# Patient Record
Sex: Female | Born: 1950
Health system: Southern US, Community
[De-identification: ages and names within clinical notes are randomized; demographics above are authoritative.]

## PROBLEM LIST (undated history)

## (undated) DIAGNOSIS — E785 Hyperlipidemia, unspecified: Secondary | ICD-10-CM

## (undated) DIAGNOSIS — E079 Disorder of thyroid, unspecified: Secondary | ICD-10-CM

## (undated) DIAGNOSIS — E119 Type 2 diabetes mellitus without complications: Secondary | ICD-10-CM

## (undated) DIAGNOSIS — I10 Essential (primary) hypertension: Secondary | ICD-10-CM

## (undated) HISTORY — PX: TUBAL LIGATION: SHX77

## (undated) HISTORY — DX: Hyperlipidemia, unspecified: E78.5

## (undated) HISTORY — DX: Essential (primary) hypertension: I10

## (undated) HISTORY — DX: Disorder of thyroid, unspecified: E07.9

## (undated) HISTORY — PX: CHOLECYSTECTOMY: SHX55

## (undated) HISTORY — DX: Type 2 diabetes mellitus without complications: E11.9

---

## 2001-11-28 ENCOUNTER — Other Ambulatory Visit: Admission: RE | Admit: 2001-11-28 | Discharge: 2001-11-28 | Payer: Self-pay | Admitting: Obstetrics and Gynecology

## 2003-03-09 ENCOUNTER — Other Ambulatory Visit: Admission: RE | Admit: 2003-03-09 | Discharge: 2003-03-09 | Payer: Self-pay | Admitting: Obstetrics and Gynecology

## 2003-08-01 ENCOUNTER — Encounter: Payer: Self-pay | Admitting: Obstetrics and Gynecology

## 2003-08-01 ENCOUNTER — Encounter: Admission: RE | Admit: 2003-08-01 | Discharge: 2003-08-01 | Payer: Self-pay | Admitting: Obstetrics and Gynecology

## 2004-10-08 ENCOUNTER — Other Ambulatory Visit: Admission: RE | Admit: 2004-10-08 | Discharge: 2004-10-08 | Payer: Self-pay | Admitting: Obstetrics and Gynecology

## 2005-01-30 ENCOUNTER — Ambulatory Visit (HOSPITAL_COMMUNITY): Admission: RE | Admit: 2005-01-30 | Discharge: 2005-01-30 | Payer: Self-pay | Admitting: Orthopaedic Surgery

## 2006-04-06 ENCOUNTER — Other Ambulatory Visit: Admission: RE | Admit: 2006-04-06 | Discharge: 2006-04-06 | Payer: Self-pay | Admitting: Obstetrics and Gynecology

## 2007-05-19 ENCOUNTER — Ambulatory Visit (HOSPITAL_COMMUNITY): Admission: RE | Admit: 2007-05-19 | Discharge: 2007-05-19 | Payer: Self-pay | Admitting: Gastroenterology

## 2007-06-02 ENCOUNTER — Ambulatory Visit (HOSPITAL_COMMUNITY): Admission: RE | Admit: 2007-06-02 | Discharge: 2007-06-02 | Payer: Self-pay | Admitting: Obstetrics and Gynecology

## 2007-06-02 ENCOUNTER — Encounter (INDEPENDENT_AMBULATORY_CARE_PROVIDER_SITE_OTHER): Payer: Self-pay | Admitting: Obstetrics and Gynecology

## 2007-06-27 ENCOUNTER — Encounter: Admission: RE | Admit: 2007-06-27 | Discharge: 2007-06-27 | Payer: Self-pay | Admitting: Family Medicine

## 2011-01-18 ENCOUNTER — Encounter: Payer: Self-pay | Admitting: Orthopaedic Surgery

## 2011-05-15 NOTE — Op Note (Signed)
Marissa Wells, Marissa Wells            ACCOUNT NO.:  [id number]   MEDICAL RECORD NO.:  [id number]          PATIENT TYPE:  AMB   LOCATION:                                FACILITY:  [facility]   PHYSICIAN:  Guy Sandifer. Henderson Cloud, M.D. DATE OF BIRTH:  Sep 12, 1951   DATE OF PROCEDURE:  06/02/2007  DATE OF DISCHARGE:  06/02/2007                               OPERATIVE REPORT   PREOPERATIVE DIAGNOSIS:  Postmenopausal bleeding.   POSTOPERATIVE DIAGNOSIS:  Postmenopausal bleeding.   PROCEDURE:  Hysteroscopy, dilatation and curettage, and 1% Xylocaine  paracervical block.   SURGEON:  Guy Sandifer. Henderson Cloud, M.D.   ANESTHESIA:  General with LMA.   SPECIMENS:  Endometrial curettings.   ESTIMATED BLOOD LOSS:  Minimal.   IN'S AND OUT'S:  Sorbitol distending media, 20-mL deficit.   INDICATIONS AND CONSENT:  This patient is a 60 year old married black  female, G3, P2, who is postmenopausal for at least two years.  She had a  one-week episode of postmenopausal bleeding.  Hysteroscopy and D and C  have been discussed preoperatively.  The potential risks and  complications were discussed preoperatively, including but not limited  to infection, uterine perforation, organ damage, bleeding requiring  transfusion of blood products, possible transfusion reaction, HIV and  hepatitis acquisition, DVT, PE, and pneumonia.  All questions were  answered and consent is found in the chart.   FINDINGS:  The endometrial cavity is without abnormal structure.  The  fallopian tubes and ostia are identified bilaterally.   PROCEDURE IN DETAIL:  The patient was taken to the operating room, where  she was identified, placed in the dorsal supine position, and general  anesthesia was induced via LMA.  The was then placed in the dorsal  lithotomy position, where she was prepped, bladder straight  catheterized, and then she was draped in the sterile fashion.  A bivalve  speculum was placed in the vagina and the anterior cervical lip  was  injected with 1% plain Xylocaine and grasped with a single-tooth  tenaculum.  A paracervical block was then placed at the 2, 4, 5, 7, 8,  and 10 o'clock positions, with approximately 20 mL of 1% plain  Xylocaine.  The cervix was then gently progressively dilated to a 27  dilator.  Diagnostic hysteroscope was placed into the endocervical canal  and advanced under direct visualization  using sorbitol distending media.  The above findings were noted.  The  hysteroscope was withdrawn and sharp curettage was carried out for scant  tissue.  The instruments were removed.  Good hemostasis was noted.  All  counts were correct.  The patient was awakened and taken to the recovery  room in stable condition.      Guy Sandifer Henderson Cloud, M.D.  Electronically Signed     JET/MEDQ  D:  06/02/2007  T:  06/02/2007  Job:  [id number]

## 2011-05-15 NOTE — H&P (Signed)
NAMEEARLENA, WERST            ACCOUNT NO.:  [id number]   MEDICAL RECORD NO.:  [id number]           PATIENT TYPE:   LOCATION:                                FACILITY:  [facility]   PHYSICIAN:  Guy Sandifer. Henderson Cloud, M.D.      DATE OF BIRTH:   DATE OF ADMISSION:  06/02/2007  DATE OF DISCHARGE:                              HISTORY & PHYSICAL   CHIEF COMPLAINT:  Postmenopausal bleeding.   HISTORY OF PRESENT ILLNESS:  This patient is a 60 year old married  African American female G3, P2 who has been amenorrheic for the past two  years.  She has occasional vasomotor symptoms.  In March, she had more  than a week of heavy menstrual flow.  She has had no bleeding subsequent  to that.  Ultrasound on May 31, 2007, revealed the uterus measuring 6.6  x 2.9 x 4.1 cm.  Endometrial stripe was 2.6 mm.  Adnexa were without  masses and there was no free fluid.  After discussing the options, she  has been admitted for  hysteroscopy D&C.  Potential risks and  complications have been discussed with the patient preoperatively.   PAST MEDICAL HISTORY:  1. Depression.  2. Chronic hypertension.  3. Spontaneous pneumothorax x2.   PAST SURGICAL HISTORY:  1. Tubal ligation.  2. D&C.  3. Cholecystectomy.   OBSTETRICAL HISTORY:  Vaginal delivery x3.   FAMILY HISTORY:  Diabetes in mother and father.  Chronic hypertension  father and sister.  Heart disease in father.  Alzheimer's disease in  aunt.  Hypothyroidism in sister.   MEDICATIONS:  Benicar and Ambien p.r.n.   ALLERGIES:  1. COMPAZINE - loss of control of her muscles.  2. LISINOPRIL - cough.   SOCIAL HISTORY:  Tobacco - half pack a day.  Denies drug or alcohol  abuse.   REVIEW OF SYSTEMS:  NEUROLOGIC:  Denies headache.  CARDIAC:  Denies  chest pain.  PULMONARY:  Denies shortness of breath.   PHYSICAL EXAMINATION:  VITAL SIGNS:  Height 5 feet 6 inches, blood  pressure 128/80.  HEENT:  Without thyromegaly.  LUNGS:  Clear to auscultation.  CARDIOVASCULAR:  Regular rate and rhythm.  BACK:  Without CVA tenderness.  BREASTS:  Without masses, retraction or discharge.  ABDOMEN:  Soft, nontender without masses.  PELVIC:  Vulva, vagina and cervix without lesion.  Uterus is normal  size, mobile and nontender.  Adnexa nontender without masses.  EXTREMITIES:  Grossly within normal limits.  NEUROLOGIC:  Grossly within normal limits.   ASSESSMENT:  Postmenopausal bleeding.   PLAN:  Hysteroscopy, possible resectoscope, dilatation and curettage.      Guy Sandifer Henderson Cloud, M.D.  Electronically Signed     JET/MEDQ  D:  05/31/2007  T:  05/31/2007  Job:  [id number]

## 2011-10-15 LAB — COMPREHENSIVE METABOLIC PANEL
AST: 21
Albumin: 3.7
Alkaline Phosphatase: 101
BUN: 10
Chloride: 110
GFR calc Af Amer: >60
Potassium: 4.2
Sodium: 141
Total Protein: 7.5

## 2011-10-15 LAB — CBC
HCT: 39.3
Platelets: 370
RDW: 14.1 — ABNORMAL HIGH
WBC: 6.6

## 2013-03-14 ENCOUNTER — Telehealth: Payer: Self-pay | Admitting: Nurse Practitioner

## 2013-03-14 ENCOUNTER — Other Ambulatory Visit: Payer: Self-pay | Admitting: Nurse Practitioner

## 2013-03-14 MED ORDER — ZOLPIDEM TARTRATE 10 MG PO TABS: 10.0000 mg | ORAL_TABLET | Freq: Every evening | ORAL | Status: DC | PRN

## 2013-03-14 NOTE — Telephone Encounter (Signed)
NEEDS ANOTHER PRESCRIPTION FOR AMBIEN 10MG . SHE WILL PICK UP WHEN READY

## 2013-03-14 NOTE — Telephone Encounter (Signed)
Let patient know to pick up RX

## 2013-03-15 ENCOUNTER — Telehealth: Payer: Self-pay | Admitting: Family Medicine

## 2013-03-15 NOTE — Telephone Encounter (Signed)
Pt aware to pick up

## 2013-04-13 ENCOUNTER — Other Ambulatory Visit: Payer: Self-pay | Admitting: Nurse Practitioner

## 2013-04-21 ENCOUNTER — Other Ambulatory Visit: Payer: Self-pay

## 2013-04-21 MED ORDER — ZOLPIDEM TARTRATE 10 MG PO TABS: 10.0000 mg | ORAL_TABLET | Freq: Every evening | ORAL | Status: DC | PRN

## 2013-04-21 NOTE — Telephone Encounter (Signed)
Call in rx for [employer]

## 2013-04-21 NOTE — Telephone Encounter (Signed)
Last written 01/27/13  Last seen 07/18/12   If approved have nurse call in and notify patient

## 2013-05-23 ENCOUNTER — Other Ambulatory Visit: Payer: Self-pay | Admitting: Nurse Practitioner

## 2013-05-24 NOTE — Telephone Encounter (Signed)
Last seen 07/13, no future appts scheduled

## 2013-06-06 ENCOUNTER — Other Ambulatory Visit: Payer: Self-pay | Admitting: *Deleted

## 2013-06-06 MED ORDER — ZOLPIDEM TARTRATE 10 MG PO TABS: 10.0000 mg | ORAL_TABLET | Freq: Every evening | ORAL | Status: DC | PRN

## 2013-06-06 NOTE — Telephone Encounter (Signed)
Please call in ambien rx with 1 refill 

## 2013-06-06 NOTE — Telephone Encounter (Signed)
LAST RF 04/21/13. LAST OV 7/13. CALL IN [facility] IF APPROVED. THANKS.

## 2013-06-06 NOTE — Telephone Encounter (Signed)
Med called to pharm 

## 2013-06-12 ENCOUNTER — Telehealth: Payer: Self-pay | Admitting: Nurse Practitioner

## 2013-06-12 ENCOUNTER — Ambulatory Visit: Payer: Self-pay | Admitting: Family Medicine

## 2013-06-12 ENCOUNTER — Ambulatory Visit (INDEPENDENT_AMBULATORY_CARE_PROVIDER_SITE_OTHER): Payer: BC Managed Care – PPO | Admitting: Physician Assistant

## 2013-06-12 ENCOUNTER — Encounter: Payer: Self-pay | Admitting: Physician Assistant

## 2013-06-12 VITALS — BP 132/76 | HR 89 | Temp 97.4°F | Ht 67.0 in | Wt 207.0 lb

## 2013-06-12 DIAGNOSIS — E039 Hypothyroidism, unspecified: Secondary | ICD-10-CM

## 2013-06-12 DIAGNOSIS — T7840XA Allergy, unspecified, initial encounter: Secondary | ICD-10-CM

## 2013-06-12 DIAGNOSIS — I10 Essential (primary) hypertension: Secondary | ICD-10-CM

## 2013-06-12 MED ORDER — DOXYCYCLINE HYCLATE 100 MG PO TABS: 100.0000 mg | ORAL_TABLET | Freq: Two times a day (BID) | ORAL | Status: DC

## 2013-06-12 NOTE — Patient Instructions (Signed)
Insect Sting Allergy An insect sting can cause pain, redness, and itching at the sting site. Symptoms of an allergic reaction are usually contained in the area of the sting site (localized). An allergic reaction usually occurs within minutes of an insect sting. Redness and swelling of the sting site may last as long as 1 week. SYMPTOMS   A local reaction at the sting site can cause:  Pain.  Redness.  Itching.  Swelling.  A systemic reaction can cause a reaction anywhere on your body. For example, you may develop the following:  Hives.  Generalized swelling.  Body aches.  Itching.  Dizziness.  Nausea or vomiting.  A more serious (anaphylactic) reaction can involve:  Difficulty breathing or wheezing.  Tongue or throat swelling.  Fainting. HOME CARE INSTRUCTIONS   If you are stung, look to see if the stinger is still in the skin. This can appear as a small, black dot at the sting site. The stinger can be removed by scraping it with a dull object such as a credit card or your fingernail. Do not use tweezers. Tweezers can squeeze the stinger and release more insect venom into the skin.  After the stinger has been removed, wash the sting site with soap and water or rubbing alcohol.  Put ice on the sting area.  Put ice in a plastic bag.  Place a towel between your skin and the bag.  Leave the ice on for 15-20 minutes, 3-4 times a day.  You can use a topical anti-itch cream, such as hydrocortisone cream, to help reduce itching.  You can take an oral antihistamine medicine to help decrease swelling and other symptoms.  Only take over-the-counter or prescription medicines for pain, discomfort, or fever as directed by your caregiver.  If prescribed, keep an epinephrine injection to temporarily treat emergency allergic reactions with you at all times. It is important to know how and when to give an epinephrine injection.  Avoid contact with stinging insects or the insect  thought to have caused your reaction.  Wear long pants when mowing grass or hiking. Wear gloves when gardening.  Use unscented deodorant and avoid strong perfumes when outdoors.  Wear a medical alert bracelet or necklace that describes your allergies.  Make sure your primary caregiver has a record of your insect sting reaction.  It may be helpful to consult with an allergy specialist. You may have other sensitivities that you are not aware of. SEEK IMMEDIATE MEDICAL CARE IF:  You experience wheezing or difficulty breathing.  You have difficulty swallowing, or you develop throat tightness.  You have mouth, tongue, or throat swelling.  You feel weak, or you faint.  You have coughing or a change in your voice.  You experience vomiting, diarrhea, or stomach cramps.  You have chest pain or lightheadedness.  You notice raised, red patches on the skin that itch. These may be early warning signs of a serious generalized or anaphylactic reaction. Call your local emergency services (911 in [place] immediately. MAKE SURE YOU:   Understand these instructions.  Will watch your condition.  Will get help right away if you are not doing well or get worse. FOR MORE INFORMATION American Academy of Allergy Asthma and Immunology: www.aaaai.org American College of Allergy, Asthma and Immunology: www.acaai.org Document Released: 11/12/2006 Document Revised: 03/07/2012 Document Reviewed: 12/24/2009 ExitCare Patient Information 2014 [employer].  

## 2013-06-12 NOTE — Progress Notes (Signed)
Subjective:     Patient ID: Marissa Wells, female   DOB: May 09, 1951, 62 y.o.   MRN: [id number]  HPI Pt with a bite to the post R shoulder last week She then took a trip and since has noticed a progressive red rash to the entire body No pain to the sites + Pruritus No SOB No fever/chills No recent ingestion of nuts and berries  Review of Systems  All other systems reviewed and are negative.       Objective:   Physical Exam  Nursing note and vitals reviewed. Raised erythem lesions to the entire body Most ~ 4 cm in diameter NT to palp No central ulceration or drainage from sites Oral- no edema, airway patent     Assessment:     ? Allergy rxn to insect bite    Plan:     OTC antihist Cool compresses Doxycycline for tick coverage- sun precaut given F/U prn

## 2013-06-12 NOTE — Telephone Encounter (Signed)
appt made

## 2013-06-21 ENCOUNTER — Other Ambulatory Visit: Payer: Self-pay | Admitting: Nurse Practitioner

## 2013-06-23 NOTE — Telephone Encounter (Signed)
Patient last saw [facility] on 6-16 but I do not see any recent labs. Please review. Last labs in chart are from 07-18-12 with a normal thyroid panel.

## 2013-07-08 ENCOUNTER — Other Ambulatory Visit: Payer: Self-pay | Admitting: Nurse Practitioner

## 2013-07-11 NOTE — Telephone Encounter (Signed)
Needs labs to have crestor filled

## 2013-07-11 NOTE — Telephone Encounter (Signed)
Not seen since 07/18/12 or labs

## 2013-08-03 ENCOUNTER — Telehealth: Payer: Self-pay | Admitting: Nurse Practitioner

## 2013-08-04 ENCOUNTER — Other Ambulatory Visit: Payer: Self-pay | Admitting: *Deleted

## 2013-08-04 MED ORDER — ROSUVASTATIN CALCIUM 10 MG PO TABS
10.0000 mg | ORAL_TABLET | Freq: Every day | ORAL | Status: DC
Start: 2013-08-04 — End: 2013-09-08

## 2013-08-04 NOTE — Telephone Encounter (Signed)
done

## 2013-09-08 ENCOUNTER — Encounter: Payer: Self-pay | Admitting: Nurse Practitioner

## 2013-09-08 ENCOUNTER — Ambulatory Visit (INDEPENDENT_AMBULATORY_CARE_PROVIDER_SITE_OTHER): Payer: BC Managed Care – PPO | Admitting: Nurse Practitioner

## 2013-09-08 VITALS — BP 115/72 | HR 86 | Temp 96.9°F | Ht 66.0 in | Wt 199.0 lb

## 2013-09-08 DIAGNOSIS — F329 Major depressive disorder, single episode, unspecified: Secondary | ICD-10-CM | POA: Insufficient documentation

## 2013-09-08 DIAGNOSIS — E785 Hyperlipidemia, unspecified: Secondary | ICD-10-CM

## 2013-09-08 DIAGNOSIS — G4700 Insomnia, unspecified: Secondary | ICD-10-CM

## 2013-09-08 DIAGNOSIS — E039 Hypothyroidism, unspecified: Secondary | ICD-10-CM

## 2013-09-08 DIAGNOSIS — I10 Essential (primary) hypertension: Secondary | ICD-10-CM

## 2013-09-08 DIAGNOSIS — E1169 Type 2 diabetes mellitus with other specified complication: Secondary | ICD-10-CM | POA: Insufficient documentation

## 2013-09-08 MED ORDER — ROSUVASTATIN CALCIUM 10 MG PO TABS: 10.0000 mg | ORAL_TABLET | Freq: Every day | ORAL | Status: DC

## 2013-09-08 MED ORDER — ZOLPIDEM TARTRATE 10 MG PO TABS: 10.0000 mg | ORAL_TABLET | Freq: Every evening | ORAL | Status: DC | PRN

## 2013-09-08 MED ORDER — SERTRALINE HCL 100 MG PO TABS: 100.0000 mg | ORAL_TABLET | Freq: Every day | ORAL | Status: DC

## 2013-09-08 MED ORDER — AMLODIPINE BESYLATE 5 MG PO TABS: 5.0000 mg | ORAL_TABLET | Freq: Every day | ORAL | Status: DC

## 2013-09-08 MED ORDER — LEVOTHYROXINE SODIUM 50 MCG PO TABS: 50.0000 ug | ORAL_TABLET | Freq: Every day | ORAL | Status: DC

## 2013-09-08 NOTE — Progress Notes (Signed)
Subjective:    Patient ID: Marissa Wells, female    DOB: 02-19-1951, 62 y.o.   MRN: [id number]  Hyperlipidemia This is a chronic problem. The current episode started more than 1 year ago. The problem is controlled. Recent lipid tests were reviewed and are normal. Exacerbating diseases include hypothyroidism. She has no history of diabetes or obesity. There are no known factors aggravating her hyperlipidemia. Pertinent negatives include no chest pain, focal sensory loss, leg pain or myalgias. Current antihyperlipidemic treatment includes statins. The current treatment provides moderate improvement of lipids. There are no compliance problems.  Risk factors for coronary artery disease include family history and hypertension.  Hypertension This is a chronic problem. The current episode started more than 1 year ago. The problem is unchanged. The problem is controlled. Pertinent negatives include no chest pain, neck pain, orthopnea, palpitations, PND or sweats. There are no associated agents to hypertension. Risk factors for coronary artery disease include dyslipidemia. Past treatments include calcium channel blockers. The current treatment provides moderate improvement. There are no compliance problems.  Hypertensive end-organ damage includes a thyroid problem.  Thyroid Problem Visit type: hypothyroidism. Symptoms include cold intolerance. Patient reports no constipation, depressed mood, dry skin, heat intolerance, hoarse voice, palpitations, tremors, visual change, weight gain or weight loss. The symptoms have been stable. Her past medical history is significant for hyperlipidemia. There is no history of diabetes.  depression zoloft working well to keep her calm and focused insomnia Remus Loffler works well but patient has been out of med for at least a week.  Review of Systems  Constitutional: Negative for weight loss and weight gain.  HENT: Negative for hoarse voice and neck pain.   Cardiovascular:  Negative for chest pain, palpitations, orthopnea and PND.  Gastrointestinal: Negative for constipation.  Endocrine: Positive for cold intolerance. Negative for heat intolerance.  Musculoskeletal: Negative for myalgias.  Neurological: Negative for tremors.  All other systems reviewed and are negative.       Objective:   Physical Exam  Constitutional: She is oriented to person, place, and time. She appears well-developed and well-nourished.  HENT:  Nose: Nose normal.  Mouth/Throat: Oropharynx is clear and moist.  Eyes: EOM are normal.  Neck: Trachea normal, normal range of motion and full passive range of motion without pain. Neck supple. No JVD present. Carotid bruit is not present. No thyromegaly present.  Cardiovascular: Normal rate, regular rhythm, normal heart sounds and intact distal pulses.  Exam reveals no gallop and no friction rub.   No murmur heard. Pulmonary/Chest: Effort normal and breath sounds normal.  Abdominal: Soft. Bowel sounds are normal. She exhibits no distension and no mass. There is no tenderness.  Musculoskeletal: Normal range of motion.  Lymphadenopathy:    She has no cervical adenopathy.  Neurological: She is alert and oriented to person, place, and time. She has normal reflexes.  Skin: Skin is warm and dry.  Psychiatric: She has a normal mood and affect. Her behavior is normal. Judgment and thought content normal.    BP 115/72  Pulse 86  Temp(Src) 96.9 F (36.1 C) (Oral)  Ht 5\' 6"  (1.676 m)  Wt 199 lb (90.266 kg)  BMI 32.13 kg/m2       Assessment & Plan:  1. Hyperlipidemia Low fat diet and exercise - rosuvastatin (CRESTOR) 10 MG tablet; Take 1 tablet (10 mg total) by mouth daily.  Dispense: 30 tablet; Refill: 5 - NMR, lipoprofile  2. Depression Stress management - sertraline (ZOLOFT) 100 MG tablet; Take 1 tablet (  100 mg total) by mouth daily.  Dispense: 30 tablet; Refill: 5  3. Insomnia Bedtime ritual - zolpidem (AMBIEN) 10 MG tablet;  Take 1 tablet (10 mg total) by mouth at bedtime as needed for sleep.  Dispense: 30 tablet; Refill: 2  4. Hypertension Low NA+ diet - amLODipine (NORVASC) 5 MG tablet; Take 1 tablet (5 mg total) by mouth daily.  Dispense: 30 tablet; Refill: 5 - CMP14+EGFR  5. Hypothyroidism  - levothyroxine (SYNTHROID, LEVOTHROID) 50 MCG tablet; Take 1 tablet (50 mcg total) by mouth daily before breakfast.  Dispense: 30 tablet; Refill: 5 - Thyroid Panel With TSH   Mary-Margaret Daphine Deutscher, FNP

## 2013-09-10 LAB — CMP14+EGFR
AST: 19 IU/L (ref 0–40)
Albumin: 4.5 g/dL (ref 3.6–4.8)
Alkaline Phosphatase: 126 IU/L — ABNORMAL HIGH (ref 39–117)
BUN/Creatinine Ratio: 19 (ref 11–26)
BUN: 18 mg/dL (ref 8–27)
Chloride: 103 mmol/L (ref 97–108)
GFR calc Af Amer: 73 mL/min/{1.73_m2} (ref >59)
Globulin, Total: 2.9 g/dL (ref 1.5–4.5)
Sodium: 141 mmol/L (ref 134–144)
Total Bilirubin: 0.2 mg/dL (ref 0.0–1.2)

## 2013-09-10 LAB — NMR, LIPOPROFILE
Cholesterol: 163 mg/dL (ref <200)
HDL Particle Number: 32.3 umol/L (ref >=30.5)
LDL Size: 20.8 nm (ref >20.5)
LP-IR Score: 56 — ABNORMAL HIGH (ref <=45)

## 2014-03-19 ENCOUNTER — Other Ambulatory Visit: Payer: Self-pay

## 2014-03-19 DIAGNOSIS — F329 Major depressive disorder, single episode, unspecified: Secondary | ICD-10-CM

## 2014-03-19 DIAGNOSIS — E039 Hypothyroidism, unspecified: Secondary | ICD-10-CM

## 2014-03-19 DIAGNOSIS — F32A Depression, unspecified: Secondary | ICD-10-CM

## 2014-03-19 DIAGNOSIS — E785 Hyperlipidemia, unspecified: Secondary | ICD-10-CM

## 2014-03-19 DIAGNOSIS — I10 Essential (primary) hypertension: Secondary | ICD-10-CM

## 2014-03-19 MED ORDER — AMLODIPINE BESYLATE 5 MG PO TABS: 5.0000 mg | ORAL_TABLET | Freq: Every day | ORAL | Status: DC

## 2014-03-19 MED ORDER — ROSUVASTATIN CALCIUM 10 MG PO TABS: 10.0000 mg | ORAL_TABLET | Freq: Every day | ORAL | Status: DC

## 2014-03-19 MED ORDER — LEVOTHYROXINE SODIUM 50 MCG PO TABS: 50.0000 ug | ORAL_TABLET | Freq: Every day | ORAL | Status: DC

## 2014-03-19 MED ORDER — SERTRALINE HCL 100 MG PO TABS: 100.0000 mg | ORAL_TABLET | Freq: Every day | ORAL | Status: DC

## 2014-03-19 NOTE — Telephone Encounter (Signed)
For assistance program  Need to be faxed to [phone]   [facility]

## 2014-03-28 ENCOUNTER — Telehealth: Payer: Self-pay | Admitting: Nurse Practitioner

## 2014-03-28 DIAGNOSIS — E039 Hypothyroidism, unspecified: Secondary | ICD-10-CM

## 2014-03-28 DIAGNOSIS — F32A Depression, unspecified: Secondary | ICD-10-CM

## 2014-03-28 DIAGNOSIS — I10 Essential (primary) hypertension: Secondary | ICD-10-CM

## 2014-03-28 DIAGNOSIS — F329 Major depressive disorder, single episode, unspecified: Secondary | ICD-10-CM

## 2014-03-28 DIAGNOSIS — E785 Hyperlipidemia, unspecified: Secondary | ICD-10-CM

## 2014-03-28 MED ORDER — ROSUVASTATIN CALCIUM 10 MG PO TABS: 10.0000 mg | ORAL_TABLET | Freq: Every day | ORAL | Status: DC

## 2014-03-28 MED ORDER — AMLODIPINE BESYLATE 5 MG PO TABS: 5.0000 mg | ORAL_TABLET | Freq: Every day | ORAL | Status: DC

## 2014-03-28 MED ORDER — LEVOTHYROXINE SODIUM 50 MCG PO TABS: 50.0000 ug | ORAL_TABLET | Freq: Every day | ORAL | Status: DC

## 2014-03-28 MED ORDER — SERTRALINE HCL 100 MG PO TABS: 100.0000 mg | ORAL_TABLET | Freq: Every day | ORAL | Status: DC

## 2014-03-28 NOTE — Telephone Encounter (Signed)
done

## 2014-06-04 ENCOUNTER — Telehealth: Payer: Self-pay | Admitting: Family Medicine

## 2014-06-04 ENCOUNTER — Other Ambulatory Visit: Payer: Self-pay | Admitting: Nurse Practitioner

## 2014-06-04 MED ORDER — ATORVASTATIN CALCIUM 40 MG PO TABS: 40.0000 mg | ORAL_TABLET | Freq: Every day | ORAL | Status: DC

## 2014-06-04 NOTE — Telephone Encounter (Signed)
crestor changed to lipitor

## 2014-08-01 ENCOUNTER — Other Ambulatory Visit: Payer: Self-pay | Admitting: Nurse Practitioner

## 2014-10-12 ENCOUNTER — Other Ambulatory Visit: Payer: Self-pay | Admitting: Nurse Practitioner

## 2014-11-14 ENCOUNTER — Other Ambulatory Visit: Payer: Self-pay | Admitting: Nurse Practitioner

## 2014-11-21 ENCOUNTER — Other Ambulatory Visit: Payer: Self-pay | Admitting: Nurse Practitioner

## 2014-11-23 ENCOUNTER — Other Ambulatory Visit: Payer: Self-pay | Admitting: Nurse Practitioner

## 2014-11-23 NOTE — Telephone Encounter (Signed)
no more refills without being seen  

## 2014-11-23 NOTE — Telephone Encounter (Signed)
Last TSH done 08/2013, no upcoming appt.

## 2014-11-24 NOTE — Telephone Encounter (Signed)
Last seen 08/2013 

## 2014-11-26 NOTE — Telephone Encounter (Signed)
no more refills without being seen  

## 2014-11-29 NOTE — Telephone Encounter (Signed)
Pt advised needs to make appt.

## 2014-12-19 ENCOUNTER — Other Ambulatory Visit: Payer: Self-pay | Admitting: Nurse Practitioner

## 2014-12-19 NOTE — Telephone Encounter (Signed)
No thyroid labs since 08/2013

## 2015-01-17 ENCOUNTER — Ambulatory Visit (INDEPENDENT_AMBULATORY_CARE_PROVIDER_SITE_OTHER): Payer: No Typology Code available for payment source | Admitting: Nurse Practitioner

## 2015-01-17 ENCOUNTER — Encounter: Payer: Self-pay | Admitting: Nurse Practitioner

## 2015-01-17 VITALS — BP 130/82 | HR 84 | Temp 96.9°F | Ht 66.0 in | Wt 195.0 lb

## 2015-01-17 DIAGNOSIS — F329 Major depressive disorder, single episode, unspecified: Secondary | ICD-10-CM

## 2015-01-17 DIAGNOSIS — Z720 Tobacco use: Secondary | ICD-10-CM

## 2015-01-17 DIAGNOSIS — Z87891 Personal history of nicotine dependence: Secondary | ICD-10-CM | POA: Insufficient documentation

## 2015-01-17 DIAGNOSIS — F17200 Nicotine dependence, unspecified, uncomplicated: Secondary | ICD-10-CM

## 2015-01-17 DIAGNOSIS — R059 Cough, unspecified: Secondary | ICD-10-CM

## 2015-01-17 DIAGNOSIS — I10 Essential (primary) hypertension: Secondary | ICD-10-CM

## 2015-01-17 DIAGNOSIS — F32A Depression, unspecified: Secondary | ICD-10-CM

## 2015-01-17 DIAGNOSIS — E039 Hypothyroidism, unspecified: Secondary | ICD-10-CM

## 2015-01-17 DIAGNOSIS — G4700 Insomnia, unspecified: Secondary | ICD-10-CM

## 2015-01-17 DIAGNOSIS — Z6831 Body mass index (BMI) 31.0-31.9, adult: Secondary | ICD-10-CM

## 2015-01-17 DIAGNOSIS — R05 Cough: Secondary | ICD-10-CM

## 2015-01-17 DIAGNOSIS — E785 Hyperlipidemia, unspecified: Secondary | ICD-10-CM

## 2015-01-17 MED ORDER — LEVOTHYROXINE SODIUM 50 MCG PO TABS: ORAL_TABLET | ORAL | Status: DC

## 2015-01-17 MED ORDER — SERTRALINE HCL 100 MG PO TABS: 100.0000 mg | ORAL_TABLET | Freq: Every day | ORAL | Status: DC

## 2015-01-17 MED ORDER — AMLODIPINE BESYLATE 5 MG PO TABS: ORAL_TABLET | ORAL | Status: DC

## 2015-01-17 MED ORDER — ATORVASTATIN CALCIUM 40 MG PO TABS: 40.0000 mg | ORAL_TABLET | Freq: Every day | ORAL | Status: DC

## 2015-01-17 MED ORDER — BENZONATATE 100 MG PO CAPS: 100.0000 mg | ORAL_CAPSULE | Freq: Two times a day (BID) | ORAL | Status: DC | PRN

## 2015-01-17 NOTE — Patient Instructions (Signed)
Cough, Adult  A cough is a reflex that helps clear your throat and airways. It can help heal the body or may be a reaction to an irritated airway. A cough may only last 2 or 3 weeks (acute) or may last more than 8 weeks (chronic).  CAUSES Acute cough:  Viral or bacterial infections. Chronic cough:  Infections.  Allergies.  Asthma.  Post-nasal drip.  Smoking.  Heartburn or acid reflux.  Some medicines.  Chronic lung problems (COPD).  Cancer. SYMPTOMS   Cough.  Fever.  Chest pain.  Increased breathing rate.  High-pitched whistling sound when breathing (wheezing).  Colored mucus that you cough up (sputum). TREATMENT   A bacterial cough may be treated with antibiotic medicine.  A viral cough must run its course and will not respond to antibiotics.  Your caregiver may recommend other treatments if you have a chronic cough. HOME CARE INSTRUCTIONS   Only take over-the-counter or prescription medicines for pain, discomfort, or fever as directed by your caregiver. Use cough suppressants only as directed by your caregiver.  Use a cold steam vaporizer or humidifier in your bedroom or home to help loosen secretions.  Sleep in a semi-upright position if your cough is worse at night.  Rest as needed.  Stop smoking if you smoke. SEEK IMMEDIATE MEDICAL CARE IF:   You have pus in your sputum.  Your cough starts to worsen.  You cannot control your cough with suppressants and are losing sleep.  You begin coughing up blood.  You have difficulty breathing.  You develop pain which is getting worse or is uncontrolled with medicine.  You have a fever. MAKE SURE YOU:   Understand these instructions.  Will watch your condition.  Will get help right away if you are not doing well or get worse. Document Released: 06/12/2011 Document Revised: 03/07/2012 Document Reviewed: 06/12/2011 ExitCare Patient Information 2015 [employer]. This information is not intended  to replace advice given to you by your health care provider. Make sure you discuss any questions you have with your health care provider.  

## 2015-01-17 NOTE — Progress Notes (Signed)
Subjective:    Patient ID: Marissa Wells, female    DOB: 1951/10/24, 64 y.o.   MRN: [id number]   Patient here today for follow up of chronic medical problems.  Hyperlipidemia This is a chronic problem. The current episode started more than 1 year ago. Recent lipid tests were reviewed and are variable. Pertinent negatives include no chest pain or myalgias. Current antihyperlipidemic treatment includes statins. The current treatment provides moderate improvement of lipids. Compliance problems include adherence to diet and adherence to exercise.  Risk factors for coronary artery disease include dyslipidemia, hypertension, obesity and post-menopausal.  Hypertension This is a chronic problem. The current episode started more than 1 year ago. The problem is unchanged. The problem is controlled. Pertinent negatives include no chest pain, neck pain or palpitations. Risk factors for coronary artery disease include dyslipidemia, obesity and post-menopausal state. Past treatments include calcium channel blockers. The current treatment provides moderate improvement. Compliance problems include diet and exercise.  Hypertensive end-organ damage includes a thyroid problem.  Thyroid Problem Visit type: hypothyroidism. Symptoms include cold intolerance. Patient reports no constipation, heat intolerance, palpitations, tremors or visual change. The symptoms have been stable. Her past medical history is significant for hyperlipidemia.  depression zoloft working well to keep her calm and focused insomnia Lorrin Mais works well but patient has been out of med for at least a week.  Review of Systems  Cardiovascular: Negative for chest pain and palpitations.  Gastrointestinal: Negative for constipation.  Endocrine: Positive for cold intolerance. Negative for heat intolerance.  Musculoskeletal: Negative for myalgias and neck pain.  Neurological: Negative for tremors.  All other systems reviewed and are negative.       Objective:   Physical Exam  Constitutional: She is oriented to person, place, and time. She appears well-developed and well-nourished.  HENT:  Nose: Nose normal.  Mouth/Throat: Oropharynx is clear and moist.  Eyes: EOM are normal.  Neck: Trachea normal, normal range of motion and full passive range of motion without pain. Neck supple. No JVD present. Carotid bruit is not present. No thyromegaly present.  Cardiovascular: Normal rate, regular rhythm, normal heart sounds and intact distal pulses.  Exam reveals no gallop and no friction rub.   No murmur heard. Pulmonary/Chest: Effort normal and breath sounds normal.  Dry cough  Abdominal: Soft. Bowel sounds are normal. She exhibits no distension and no mass. There is no tenderness.  Musculoskeletal: Normal range of motion.  Lymphadenopathy:    She has no cervical adenopathy.  Neurological: She is alert and oriented to person, place, and time. She has normal reflexes.  Skin: Skin is warm and dry.  Psychiatric: She has a normal mood and affect. Her behavior is normal. Judgment and thought content normal.    BP 130/82 mmHg  Pulse 84  Temp(Src) 96.9 F (36.1 C) (Oral)  Ht _0  (1.676 m)  Wt 195 lb (88.451 kg)  BMI 31.49 kg/m2        Assessment & Plan:  1. BMI 31.0-31.9,adult Discussed diet and exercise for person with BMI >25 Will recheck weight in 3-6 months   2. Essential hypertension Do not add salt to diet - amLODipine (NORVASC) 5 MG tablet; TAKE 1 TABLET (5 MG TOTAL) BY MOUTH DAILY.  Dispense: 90 tablet; Refill: 1 - CMP14+EGFR  3. Hypothyroidism, unspecified hypothyroidism type - levothyroxine (SYNTHROID, LEVOTHROID) 50 MCG tablet; TAKE ONE TABLET BY MOUTH ONE TIME DAILY BEFORE BREAKFAST  Dispense: 90 tablet; Refill: 1 - Thyroid Panel With TSH  4. Insomnia Bedtime ritual  5. Hyperlipidemia Low fat diet - atorvastatin (LIPITOR) 40 MG tablet; Take 1 tablet (40 mg total) by mouth daily.  Dispense: 90 tablet; Refill:  1 - NMR, lipoprofile  6. Depression Stress management - sertraline (ZOLOFT) 100 MG tablet; Take 1 tablet (100 mg total) by mouth daily.  Dispense: 90 tablet; Refill: 1  7. Tobacco use disorder Smoking cessation  8. Cough Avoid decongestants Force fluids - benzonatate (TESSALON) 100 MG capsule; Take 1 capsule (100 mg total) by mouth 2 (two) times daily as needed for cough.  Dispense: 20 capsule; Refill: 0   Refuses adult immunizations Labs pending Health maintenance reviewed Diet and exercise encouraged Continue all meds Follow up  In 3 months   [place], FNP

## 2015-01-18 LAB — CMP14+EGFR
ALBUMIN: 4.3 g/dL (ref 3.6–4.8)
ALT: 12 IU/L (ref 0–32)
AST: 15 IU/L (ref 0–40)
Albumin/Globulin Ratio: 1.3 (ref 1.1–2.5)
Alkaline Phosphatase: 205 IU/L — ABNORMAL HIGH (ref 39–117)
BUN/Creatinine Ratio: 19 (ref 11–26)
BUN: 17 mg/dL (ref 8–27)
CO2: 22 mmol/L (ref 18–29)
CREATININE: 0.88 mg/dL (ref 0.57–1.00)
Calcium: 9.2 mg/dL (ref 8.7–10.3)
Chloride: 102 mmol/L (ref 97–108)
GFR calc Af Amer: 82 mL/min/{1.73_m2} (ref >59)
GFR calc non Af Amer: 71 mL/min/{1.73_m2} (ref >59)
Globulin, Total: 3.2 g/dL (ref 1.5–4.5)
Glucose: 92 mg/dL (ref 65–99)
Potassium: 4.6 mmol/L (ref 3.5–5.2)
SODIUM: 139 mmol/L (ref 134–144)
Total Bilirubin: 0.2 mg/dL (ref 0.0–1.2)
Total Protein: 7.5 g/dL (ref 6.0–8.5)

## 2015-01-18 LAB — NMR, LIPOPROFILE
CHOLESTEROL: 235 mg/dL — AB (ref 100–199)
HDL Cholesterol by NMR: 58 mg/dL (ref >39)
HDL Particle Number: 34.4 umol/L (ref >=30.5)
LDL Particle Number: 1698 nmol/L — ABNORMAL HIGH (ref <1000)
LDL Size: 21.3 nm (ref >20.5)
LDL-C: 153 mg/dL — ABNORMAL HIGH (ref 0–99)
LP-IR SCORE: 60 — AB (ref <=45)
SMALL LDL PARTICLE NUMBER: 515 nmol/L (ref <=527)
Triglycerides by NMR: 118 mg/dL (ref 0–149)

## 2015-01-18 LAB — THYROID PANEL WITH TSH
Free Thyroxine Index: 2.2 (ref 1.2–4.9)
T3 Uptake Ratio: 27 % (ref 24–39)
T4 TOTAL: 8.2 ug/dL (ref 4.5–12.0)
TSH: 2.8 u[IU]/mL (ref 0.450–4.500)

## 2015-03-29 ENCOUNTER — Encounter: Payer: Self-pay | Admitting: Family

## 2015-03-29 ENCOUNTER — Telehealth: Payer: Self-pay | Admitting: Nurse Practitioner

## 2015-03-29 ENCOUNTER — Ambulatory Visit (INDEPENDENT_AMBULATORY_CARE_PROVIDER_SITE_OTHER): Payer: No Typology Code available for payment source | Admitting: Family

## 2015-03-29 VITALS — BP 126/78 | HR 89 | Temp 99.2°F | Ht 66.0 in | Wt 188.0 lb

## 2015-03-29 DIAGNOSIS — J029 Acute pharyngitis, unspecified: Secondary | ICD-10-CM | POA: Diagnosis not present

## 2015-03-29 DIAGNOSIS — R059 Cough, unspecified: Secondary | ICD-10-CM

## 2015-03-29 DIAGNOSIS — R05 Cough: Secondary | ICD-10-CM

## 2015-03-29 DIAGNOSIS — J069 Acute upper respiratory infection, unspecified: Secondary | ICD-10-CM | POA: Diagnosis not present

## 2015-03-29 DIAGNOSIS — R509 Fever, unspecified: Secondary | ICD-10-CM | POA: Diagnosis not present

## 2015-03-29 LAB — POCT INFLUENZA A/B
INFLUENZA A, POC: NEGATIVE
INFLUENZA B, POC: NEGATIVE

## 2015-03-29 LAB — POCT RAPID STREP A (OFFICE): RAPID STREP A SCREEN: NEGATIVE

## 2015-03-29 MED ORDER — BENZONATATE 200 MG PO CAPS: 200.0000 mg | ORAL_CAPSULE | Freq: Three times a day (TID) | ORAL | Status: DC | PRN

## 2015-03-29 MED ORDER — AZITHROMYCIN 250 MG PO TABS: ORAL_TABLET | ORAL | Status: DC

## 2015-03-29 MED ORDER — HYDROCODONE-HOMATROPINE 5-1.5 MG/5ML PO SYRP: 5.0000 mL | ORAL_SOLUTION | Freq: Three times a day (TID) | ORAL | Status: DC | PRN

## 2015-03-29 NOTE — Progress Notes (Signed)
Subjective:    Patient ID: Marissa Wells, female    DOB: [birth year], 64 y.o.   MRN: [id number]  Cough Associated symptoms include chills, a fever, headaches and a sore throat. Pertinent negatives include no myalgias, rash or shortness of breath.  Influenza This is a new problem. The current episode started in the past 7 days (Tuesday). The problem occurs constantly. The problem has been gradually improving. Associated symptoms include chills, congestion, coughing, fatigue, a fever, headaches and a sore throat. Pertinent negatives include no myalgias, nausea, rash, urinary symptoms, vomiting or weakness. She has tried acetaminophen and rest for the symptoms. The treatment provided mild relief.      Review of Systems  Constitutional: Positive for fever, chills and fatigue.  HENT: Positive for congestion and sore throat.   Eyes: Negative.   Respiratory: Positive for cough. Negative for shortness of breath.   Cardiovascular: Negative.  Negative for palpitations.  Gastrointestinal: Negative.  Negative for nausea and vomiting.  Endocrine: Negative.   Genitourinary: Negative.   Musculoskeletal: Negative.  Negative for myalgias.  Skin: Negative for rash.  Neurological: Positive for headaches. Negative for weakness.  Hematological: Negative.   Psychiatric/Behavioral: Negative.   All other systems reviewed and are negative.      Objective:   Physical Exam  Constitutional: She is oriented to person, place, and time. She appears well-developed and well-nourished. No distress.  HENT:  Head: Normocephalic and atraumatic.  Right Ear: External ear normal.  Left Ear: External ear normal.  Nasal passage erythemas with mild swelling  Oropharynx erythemas   Eyes: Pupils are equal, round, and reactive to light.  Neck: Normal range of motion. Neck supple. No thyromegaly present.  Cardiovascular: Normal rate, regular rhythm, normal heart sounds and intact distal pulses.   No murmur  heard. Pulmonary/Chest: Effort normal and breath sounds normal. No respiratory distress. She has no wheezes.  Nonproductive cough   Abdominal: Soft. Bowel sounds are normal. She exhibits no distension. There is no tenderness.  Musculoskeletal: Normal range of motion. She exhibits no edema or tenderness.  Neurological: She is alert and oriented to person, place, and time. She has normal reflexes. No cranial nerve deficit.  Skin: Skin is warm and dry.  Psychiatric: She has a normal mood and affect. Her behavior is normal. Judgment and thought content normal.  Vitals reviewed.     BP 126/78 mmHg  Pulse 89  Temp(Src) 99.2 F (37.3 C) (Oral)  Ht  (1.676 m)  Wt 188 lb (85.276 kg)  BMI 30.36 kg/m2     Assessment & Plan:  1. Sore throat - POCT rapid strep A  2. Fever, unspecified fever cause - POCT Influenza A/B - POCT rapid strep A  3. Cough - POCT Influenza A/B - POCT rapid strep A - HYDROcodone-homatropine (HYCODAN) 5-1.5 MG/5ML syrup; Take 5 mLs by mouth every 8 (eight) hours as needed for cough.  Dispense: 120 mL; Refill: 0  4. Acute upper respiratory infection -- Take meds as prescribed - Use a cool mist humidifier  -Use saline nose sprays frequently -Saline irrigations of the nose can be very helpful if done frequently.  * 4X daily for 1 week*  * Use of a nettie pot can be helpful with this. Follow directions with this* -Force fluids -For any cough or congestion  Use plain Mucinex- regular strength or max strength is fine   * Children- consult with Pharmacist for dosing -For fever or aces or pains- take tylenol or ibuprofen appropriate for age  and weight.  * for fevers greater than 101 orally you may alternate ibuprofen and tylenol every  3 hours. -Throat lozenges if help - azithromycin (ZITHROMAX) 250 MG tablet; Take 500 mg once, then 250 mg for four days  Dispense: 6 tablet; Refill: 0  Jannifer Rodneyhristy Cecilio Ohlrich, FNP

## 2015-03-29 NOTE — Patient Instructions (Signed)
Upper Respiratory Infection, Adult An upper respiratory infection (URI) is also sometimes known as the common cold. The upper respiratory tract includes the nose, sinuses, throat, trachea, and bronchi. Bronchi are the airways leading to the lungs. Most people improve within 1 week, but symptoms can last up to 2 weeks. A residual cough may last even longer.  CAUSES Many different viruses can infect the tissues lining the upper respiratory tract. The tissues become irritated and inflamed and often become very moist. Mucus production is also common. A cold is contagious. You can easily spread the virus to others by oral contact. This includes kissing, sharing a glass, coughing, or sneezing. Touching your mouth or nose and then touching a surface, which is then touched by another person, can also spread the virus. SYMPTOMS  Symptoms typically develop 1 to 3 days after you come in contact with a cold virus. Symptoms vary from person to person. They may include:  Runny nose.  Sneezing.  Nasal congestion.  Sinus irritation.  Sore throat.  Loss of voice (laryngitis).  Cough.  Fatigue.  Muscle aches.  Loss of appetite.  Headache.  Low-grade fever. DIAGNOSIS  You might diagnose your own cold based on familiar symptoms, since most people get a cold 2 to 3 times a year. Your caregiver can confirm this based on your exam. Most importantly, your caregiver can check that your symptoms are not due to another disease such as strep throat, sinusitis, pneumonia, asthma, or epiglottitis. Blood tests, throat tests, and X-rays are not necessary to diagnose a common cold, but they may sometimes be helpful in excluding other more serious diseases. Your caregiver will decide if any further tests are required. RISKS AND COMPLICATIONS  You may be at risk for a more severe case of the common cold if you smoke cigarettes, have chronic heart disease (such as heart failure) or lung disease (such as asthma), or if  you have a weakened immune system. The very young and very old are also at risk for more serious infections. Bacterial sinusitis, middle ear infections, and bacterial pneumonia can complicate the common cold. The common cold can worsen asthma and chronic obstructive pulmonary disease (COPD). Sometimes, these complications can require emergency medical care and may be life-threatening. PREVENTION  The best way to protect against getting a cold is to practice good hygiene. Avoid oral or hand contact with people with cold symptoms. Wash your hands often if contact occurs. There is no clear evidence that vitamin C, vitamin E, echinacea, or exercise reduces the chance of developing a cold. However, it is always recommended to get plenty of rest and practice good nutrition. TREATMENT  Treatment is directed at relieving symptoms. There is no cure. Antibiotics are not effective, because the infection is caused by a virus, not by bacteria. Treatment may include:  Increased fluid intake. Sports drinks offer valuable electrolytes, sugars, and fluids.  Breathing heated mist or steam (vaporizer or shower).  Eating chicken soup or other clear broths, and maintaining good nutrition.  Getting plenty of rest.  Using gargles or lozenges for comfort.  Controlling fevers with ibuprofen or acetaminophen as directed by your caregiver.  Increasing usage of your inhaler if you have asthma. Zinc gel and zinc lozenges, taken in the first 24 hours of the common cold, can shorten the duration and lessen the severity of symptoms. Pain medicines may help with fever, muscle aches, and throat pain. A variety of non-prescription medicines are available to treat congestion and runny nose. Your caregiver   can make recommendations and may suggest nasal or lung inhalers for other symptoms.  HOME CARE INSTRUCTIONS   Only take over-the-counter or prescription medicines for pain, discomfort, or fever as directed by your  caregiver.  Use a warm mist humidifier or inhale steam from a shower to increase air moisture. This may keep secretions moist and make it easier to breathe.  Drink enough water and fluids to keep your urine clear or pale yellow.  Rest as needed.  Return to work when your temperature has returned to normal or as your caregiver advises. You may need to stay home longer to avoid infecting others. You can also use a face mask and careful hand washing to prevent spread of the virus. SEEK MEDICAL CARE IF:   After the first few days, you feel you are getting worse rather than better.  You need your caregiver's advice about medicines to control symptoms.  You develop chills, worsening shortness of breath, or brown or red sputum. These may be signs of pneumonia.  You develop yellow or brown nasal discharge or pain in the face, especially when you bend forward. These may be signs of sinusitis.  You develop a fever, swollen neck glands, pain with swallowing, or white areas in the back of your throat. These may be signs of strep throat. SEEK IMMEDIATE MEDICAL CARE IF:   You have a fever.  You develop severe or persistent headache, ear pain, sinus pain, or chest pain.  You develop wheezing, a prolonged cough, cough up blood, or have a change in your usual mucus (if you have chronic lung disease).  You develop sore muscles or a stiff neck. Document Released: 06/09/2001 Document Revised: 03/07/2012 Document Reviewed: 03/21/2014 Exit[employer] Patient Information 2015 [employer]. This information is not intended to replace advice given to you by your health care provider. Make sure you discuss any questions you have with your health care provider.  - Take meds as prescribed - Use a cool mist humidifier  -Use saline nose sprays frequently -Saline irrigations of the nose can be very helpful if done frequently.  * 4X daily for 1 week*  * Use of a nettie pot can be helpful with this. Follow  directions with this* -Force fluids -For any cough or congestion  Use plain Mucinex- regular strength or max strength is fine   * Children- consult with Pharmacist for dosing -For fever or aces or pains- take tylenol or ibuprofen appropriate for age and weight.  * for fevers greater than 101 orally you may alternate ibuprofen and tylenol every  3 hours. -Throat lozenges if help   Torrell Krutz, FNP   

## 2015-03-29 NOTE — Telephone Encounter (Signed)
Appt made for tonight

## 2015-04-06 ENCOUNTER — Other Ambulatory Visit: Payer: Self-pay | Admitting: Nurse Practitioner

## 2015-09-23 ENCOUNTER — Other Ambulatory Visit: Payer: Self-pay | Admitting: Nurse Practitioner

## 2015-09-24 NOTE — Telephone Encounter (Signed)
Last seen 03/29/15 Marissa Wells  Last lipid 01/17/15

## 2015-10-15 ENCOUNTER — Other Ambulatory Visit: Payer: Self-pay | Admitting: Nurse Practitioner

## 2015-10-25 ENCOUNTER — Other Ambulatory Visit: Payer: Self-pay | Admitting: Nurse Practitioner

## 2015-10-25 NOTE — Telephone Encounter (Signed)
Last refill without being seen 

## 2015-10-26 ENCOUNTER — Other Ambulatory Visit: Payer: Self-pay | Admitting: Physician Assistant

## 2015-10-28 NOTE — Telephone Encounter (Signed)
Na. Pt NTBS

## 2015-10-28 NOTE — Telephone Encounter (Signed)
lipitor refill denied-Patient NTBS for follow up and lab work  

## 2015-11-04 ENCOUNTER — Other Ambulatory Visit: Payer: Self-pay | Admitting: Nurse Practitioner

## 2015-11-04 DIAGNOSIS — F32A Depression, unspecified: Secondary | ICD-10-CM

## 2015-11-04 DIAGNOSIS — F329 Major depressive disorder, single episode, unspecified: Secondary | ICD-10-CM

## 2015-11-04 MED ORDER — ATORVASTATIN CALCIUM 40 MG PO TABS: 40.0000 mg | ORAL_TABLET | Freq: Every day | ORAL | Status: DC

## 2015-11-04 MED ORDER — SERTRALINE HCL 100 MG PO TABS: 100.0000 mg | ORAL_TABLET | Freq: Every day | ORAL | Status: DC

## 2015-11-04 NOTE — Telephone Encounter (Signed)
Left detailed message stating we could send in one 30 day supply in of both zoloft and atorvastatin but no further refills until being seen in office. To CB with any further questions and concerns.

## 2015-11-11 ENCOUNTER — Encounter: Payer: Self-pay | Admitting: Nurse Practitioner

## 2015-11-11 ENCOUNTER — Ambulatory Visit (INDEPENDENT_AMBULATORY_CARE_PROVIDER_SITE_OTHER): Payer: No Typology Code available for payment source | Admitting: Nurse Practitioner

## 2015-11-11 VITALS — BP 134/78 | HR 86 | Temp 97.2°F | Ht 66.0 in | Wt 188.0 lb

## 2015-11-11 DIAGNOSIS — F17200 Nicotine dependence, unspecified, uncomplicated: Secondary | ICD-10-CM | POA: Diagnosis not present

## 2015-11-11 DIAGNOSIS — E039 Hypothyroidism, unspecified: Secondary | ICD-10-CM

## 2015-11-11 DIAGNOSIS — E785 Hyperlipidemia, unspecified: Secondary | ICD-10-CM | POA: Diagnosis not present

## 2015-11-11 DIAGNOSIS — Z1212 Encounter for screening for malignant neoplasm of rectum: Secondary | ICD-10-CM

## 2015-11-11 DIAGNOSIS — I10 Essential (primary) hypertension: Secondary | ICD-10-CM

## 2015-11-11 DIAGNOSIS — F329 Major depressive disorder, single episode, unspecified: Secondary | ICD-10-CM | POA: Diagnosis not present

## 2015-11-11 DIAGNOSIS — F32A Depression, unspecified: Secondary | ICD-10-CM

## 2015-11-11 DIAGNOSIS — Z1159 Encounter for screening for other viral diseases: Secondary | ICD-10-CM | POA: Diagnosis not present

## 2015-11-11 DIAGNOSIS — G4700 Insomnia, unspecified: Secondary | ICD-10-CM | POA: Diagnosis not present

## 2015-11-11 MED ORDER — LEVOTHYROXINE SODIUM 50 MCG PO TABS: ORAL_TABLET | ORAL | Status: DC

## 2015-11-11 MED ORDER — AMLODIPINE BESYLATE 5 MG PO TABS: 5.0000 mg | ORAL_TABLET | Freq: Every day | ORAL | Status: DC

## 2015-11-11 MED ORDER — ATORVASTATIN CALCIUM 40 MG PO TABS: 40.0000 mg | ORAL_TABLET | Freq: Every day | ORAL | Status: DC

## 2015-11-11 MED ORDER — SERTRALINE HCL 100 MG PO TABS: 100.0000 mg | ORAL_TABLET | Freq: Every day | ORAL | Status: DC

## 2015-11-11 NOTE — Patient Instructions (Signed)
Bupropion sustained-release tablets (smoking cessation) What is this medicine? BUPROPION (byoo PROE pee on) is used to help people quit smoking. This medicine may be used for other purposes; ask your health care provider or pharmacist if you have questions. What should I tell my health care provider before I take this medicine? They need to know if you have any of these conditions: -an eating disorder, such as anorexia or bulimia -bipolar disorder or psychosis -diabetes or high blood sugar, treated with medication -glaucoma -head injury or brain tumor -heart disease, previous heart attack, or irregular heart beat -high blood pressure -kidney or liver disease -seizures -suicidal thoughts or a previous suicide attempt -Tourette's syndrome -weight loss -an unusual or allergic reaction to bupropion, other medicines, foods, dyes, or preservatives -breast-feeding -pregnant or trying to become pregnant How should I use this medicine? Take this medicine by mouth with a glass of water. Follow the directions on the prescription label. You can take it with or without food. If it upsets your stomach, take it with food. Do not cut, crush or chew this medicine. Take your medicine at regular intervals. If you take this medicine more than once a day, take your second dose at least 8 hours after you take your first dose. To limit difficulty in sleeping, avoid taking this medicine at bedtime. Do not take your medicine more often than directed. Do not stop taking this medicine suddenly except upon the advice of your doctor. Stopping this medicine too quickly may cause serious side effects. A special MedGuide will be given to you by the pharmacist with each prescription and refill. Be sure to read this information carefully each time. Talk to your pediatrician regarding the use of this medicine in children. Special care may be needed. Overdosage: If you think you have taken too much of this medicine contact a  poison control center or emergency room at once. NOTE: This medicine is only for you. Do not share this medicine with others. What if I miss a dose? If you miss a dose, skip the missed dose and take your next tablet at the regular time. There should be at least 8 hours between doses. Do not take double or extra doses. What may interact with this medicine? Do not take this medicine with any of the following medications: -linezolid -MAOIs like Azilect, Carbex, Eldepryl, Marplan, Nardil, and Parnate -methylene blue (injected into a vein) -other medicines that contain bupropion like Wellbutrin This medicine may also interact with the following medications: -alcohol -certain medicines for anxiety or sleep -certain medicines for blood pressure like metoprolol, propranolol -certain medicines for depression or psychotic disturbances -certain medicines for HIV or AIDS like efavirenz, lopinavir, nelfinavir, ritonavir -certain medicines for irregular heart beat like propafenone, flecainide -certain medicines for Parkinson's disease like amantadine, levodopa -certain medicines for seizures like carbamazepine, phenytoin, phenobarbital -cimetidine -clopidogrel -cyclophosphamide -furazolidone -isoniazid -nicotine -orphenadrine -procarbazine -steroid medicines like prednisone or cortisone -stimulant medicines for attention disorders, weight loss, or to stay awake -tamoxifen -theophylline -thiotepa -ticlopidine -tramadol -warfarin This list may not describe all possible interactions. Give your health care provider a list of all the medicines, herbs, non-prescription drugs, or dietary supplements you use. Also tell them if you smoke, drink alcohol, or use illegal drugs. Some items may interact with your medicine. What should I watch for while using this medicine? Visit your doctor or health care professional for regular checks on your progress. This medicine should be used together with a patient  support program. It is important   to participate in a behavioral program, counseling, or other support program that is recommended by your health care professional. Patients and their families should watch out for new or worsening thoughts of suicide or depression. Also watch out for sudden changes in feelings such as feeling anxious, agitated, panicky, irritable, hostile, aggressive, impulsive, severely restless, overly excited and hyperactive, or not being able to sleep. If this happens, especially at the beginning of treatment or after a change in dose, call your health care professional. Avoid alcoholic drinks while taking this medicine. Drinking excessive alcoholic beverages, using sleeping or anxiety medicines, or quickly stopping the use of these agents while taking this medicine may increase your risk for a seizure. Do not drive or use heavy machinery until you know how this medicine affects you. This medicine can impair your ability to perform these tasks. Do not take this medicine close to bedtime. It may prevent you from sleeping. Your mouth may get dry. Chewing sugarless gum or sucking hard candy, and drinking plenty of water may help. Contact your doctor if the problem does not go away or is severe. Do not use nicotine patches or chewing gum without the advice of your doctor or health care professional while taking this medicine. You may need to have your blood pressure taken regularly if your doctor recommends that you use both nicotine and this medicine together. What side effects may I notice from receiving this medicine? Side effects that you should report to your doctor or health care professional as soon as possible: -allergic reactions like skin rash, itching or hives, swelling of the face, lips, or tongue -breathing problems -changes in vision -confusion -fast or irregular heartbeat -hallucinations -increased blood pressure -redness, blistering, peeling or loosening of the skin,  including inside the mouth -seizures -suicidal thoughts or other mood changes -unusually weak or tired -vomiting Side effects that usually do not require medical attention (report to your doctor or health care professional if they continue or are bothersome): -change in sex drive or performance -constipation -headache -loss of appetite -nausea -tremors -weight loss This list may not describe all possible side effects. Call your doctor for medical advice about side effects. You may report side effects to FDA at 1-800-FDA-1088. Where should I keep my medicine? Keep out of the reach of children. Store at room temperature between 20 and 25 degrees C (68 and 77 degrees F). Protect from light. Keep container tightly closed. Throw away any unused medicine after the expiration date. NOTE: This sheet is a summary. It may not cover all possible information. If you have questions about this medicine, talk to your doctor, pharmacist, or health care provider.    2016, [employer]. (2013-08-11 10:55:10)  

## 2015-11-11 NOTE — Progress Notes (Signed)
Subjective:    Patient ID: Marissa Wells, female    DOB: May 11, 1951, 64 y.o.   MRN: [id number]   Patient here today for follow up of chronic medical problems. No complaints today.  Hyperlipidemia This is a chronic problem. The current episode started more than 1 year ago. Recent lipid tests were reviewed and are variable. Pertinent negatives include no chest pain or myalgias. Current antihyperlipidemic treatment includes statins. The current treatment provides moderate improvement of lipids. Compliance problems include adherence to diet and adherence to exercise.  Risk factors for coronary artery disease include dyslipidemia, hypertension, obesity and post-menopausal.  Hypertension This is a chronic problem. The current episode started more than 1 year ago. The problem is unchanged. The problem is controlled. Pertinent negatives include no chest pain, neck pain or palpitations. Risk factors for coronary artery disease include dyslipidemia, obesity and post-menopausal state. Past treatments include calcium channel blockers. The current treatment provides moderate improvement. Compliance problems include diet and exercise.  Hypertensive end-organ damage includes a thyroid problem.  Thyroid Problem Visit type: hypothyroidism. Symptoms include cold intolerance. Patient reports no constipation, heat intolerance, palpitations, tremors or visual change. The symptoms have been stable. Her past medical history is significant for hyperlipidemia.  depression zoloft working well to keep her calm and focused insomnia Lorrin Mais works well but patient has been out of med for at least a week.  Review of Systems  Cardiovascular: Negative for chest pain and palpitations.  Gastrointestinal: Negative for constipation.  Endocrine: Positive for cold intolerance. Negative for heat intolerance.  Musculoskeletal: Negative for myalgias and neck pain.  Neurological: Negative for tremors.  All other systems reviewed and  are negative.      Objective:   Physical Exam  Constitutional: She is oriented to person, place, and time. She appears well-developed and well-nourished.  HENT:  Nose: Nose normal.  Mouth/Throat: Oropharynx is clear and moist.  Eyes: EOM are normal.  Neck: Trachea normal, normal range of motion and full passive range of motion without pain. Neck supple. No JVD present. Carotid bruit is not present. No thyromegaly present.  Cardiovascular: Normal rate, regular rhythm, normal heart sounds and intact distal pulses.  Exam reveals no gallop and no friction rub.   No murmur heard. Pulmonary/Chest: Effort normal and breath sounds normal.  Dry cough  Abdominal: Soft. Bowel sounds are normal. She exhibits no distension and no mass. There is no tenderness.  Musculoskeletal: Normal range of motion.  Lymphadenopathy:    She has no cervical adenopathy.  Neurological: She is alert and oriented to person, place, and time. She has normal reflexes.  Skin: Skin is warm and dry.  Psychiatric: She has a normal mood and affect. Her behavior is normal. Judgment and thought content normal.    BP 134/78 mmHg  Pulse 86  Temp(Src) 97.2 F (36.2 C) (Oral)  Ht 5' 6" (1.676 m)  Wt 188 lb (85.276 kg)  BMI 30.36 kg/m2        Assessment & Plan:  1. Essential hypertension Do not add salt to diet - amLODipine (NORVASC) 5 MG tablet; Take 1 tablet (5 mg total) by mouth daily.  Dispense: 90 tablet; Refill: 1 - CMP14+EGFR  2. Hypothyroidism, unspecified hypothyroidism type - levothyroxine (SYNTHROID, LEVOTHROID) 50 MCG tablet; TAKE ONE TABLET BY MOUTH ONE TIME DAILY BEFORE BREAKFAST  Dispense: 34 tablet; Refill: 2 - Thyroid Panel With TSH  3. Hyperlipidemia Low fat  diet - atorvastatin (LIPITOR) 40 MG tablet; Take 1 tablet (40 mg total) by mouth daily.  Dispense: 90 tablet; Refill: 1 - Lipid panel  4. Depression Stress management - sertraline (ZOLOFT) 100 MG tablet; Take 1 tablet (100 mg total) by  mouth daily.  Dispense: 90 tablet; Refill: 1  5. Insomnia Bedtime ritual  6. Tobacco use disorder Encourage to stop smoking   hemmocult cards given Tested for hep C Labs pending Health maintenance reviewed Diet and exercise encouraged Continue all meds Follow up  In 6 month   Mary-Margaret Martin, FNP    

## 2015-11-12 LAB — LIPID PANEL
CHOL/HDL RATIO: 2.8 ratio (ref 0.0–4.4)
Cholesterol, Total: 144 mg/dL (ref 100–199)
HDL: 52 mg/dL (ref >39)
LDL CALC: 66 mg/dL (ref 0–99)
Triglycerides: 130 mg/dL (ref 0–149)
VLDL Cholesterol Cal: 26 mg/dL (ref 5–40)

## 2015-11-12 LAB — CMP14+EGFR
ALBUMIN: 4.3 g/dL (ref 3.6–4.8)
ALT: 14 IU/L (ref 0–32)
AST: 17 IU/L (ref 0–40)
Albumin/Globulin Ratio: 1.5 (ref 1.1–2.5)
Alkaline Phosphatase: 148 IU/L — ABNORMAL HIGH (ref 39–117)
BUN / CREAT RATIO: 17 (ref 11–26)
BUN: 16 mg/dL (ref 8–27)
Bilirubin Total: <0.2 mg/dL (ref 0.0–1.2)
CALCIUM: 9.3 mg/dL (ref 8.7–10.3)
CHLORIDE: 106 mmol/L (ref 97–106)
CO2: 22 mmol/L (ref 18–29)
Creatinine, Ser: 0.95 mg/dL (ref 0.57–1.00)
GFR calc Af Amer: 74 mL/min/{1.73_m2} (ref >59)
GFR calc non Af Amer: 64 mL/min/{1.73_m2} (ref >59)
Globulin, Total: 2.9 g/dL (ref 1.5–4.5)
Glucose: 87 mg/dL (ref 65–99)
Potassium: 4.3 mmol/L (ref 3.5–5.2)
SODIUM: 143 mmol/L (ref 136–144)
Total Protein: 7.2 g/dL (ref 6.0–8.5)

## 2015-11-12 LAB — HEPATITIS C ANTIBODY: Hep C Virus Ab: <0.1 s/co ratio (ref 0.0–0.9)

## 2015-11-12 LAB — THYROID PANEL WITH TSH
FREE THYROXINE INDEX: 2.1 (ref 1.2–4.9)
T3 UPTAKE RATIO: 26 % (ref 24–39)
T4, Total: 7.9 ug/dL (ref 4.5–12.0)
TSH: 1.97 u[IU]/mL (ref 0.450–4.500)

## 2016-03-13 ENCOUNTER — Telehealth: Payer: Self-pay | Admitting: Nurse Practitioner

## 2016-03-16 ENCOUNTER — Encounter: Payer: Self-pay | Admitting: Family Medicine

## 2016-03-16 ENCOUNTER — Ambulatory Visit (INDEPENDENT_AMBULATORY_CARE_PROVIDER_SITE_OTHER): Payer: No Typology Code available for payment source | Admitting: Family Medicine

## 2016-03-16 VITALS — BP 110/65 | HR 89 | Temp 96.9°F | Ht 66.0 in | Wt 183.0 lb

## 2016-03-16 DIAGNOSIS — S46911A Strain of unspecified muscle, fascia and tendon at shoulder and upper arm level, right arm, initial encounter: Secondary | ICD-10-CM | POA: Diagnosis not present

## 2016-03-16 MED ORDER — MELOXICAM 7.5 MG PO TABS: 7.5000 mg | ORAL_TABLET | Freq: Every day | ORAL | Status: DC

## 2016-03-16 NOTE — Patient Instructions (Signed)
Generic Shoulder Exercises  EXERCISES   RANGE OF MOTION (ROM) AND STRETCHING EXERCISES  These exercises may help you when beginning to rehabilitate your injury. Your symptoms may resolve with or without further involvement from your physician, physical therapist or athletic trainer. While completing these exercises, remember:   · Restoring tissue flexibility helps normal motion to return to the joints. This allows healthier, less painful movement and activity.  · An effective stretch should be held for at least 30 seconds.  · A stretch should never be painful. You should only feel a gentle lengthening or release in the stretched tissue.  ROM - Pendulum  · Bend at the waist so that your right / left arm falls away from your body. Support yourself with your opposite hand on a solid surface, such as a table or a countertop.  · Your right / left arm should be perpendicular to the ground. If it is not perpendicular, you need to lean over farther. Relax the muscles in your right / left arm and shoulder as much as possible.  · Gently sway your hips and trunk so they move your right / left arm without any use of your right / left shoulder muscles.  · Progress your movements so that your right / left arm moves side to side, then forward and backward, and finally, both clockwise and counterclockwise.  · Complete __________ repetitions in each direction. Many people use this exercise to relieve discomfort in their shoulder as well as to gain range of motion.  Repeat __________ times. Complete this exercise __________ times per day.  STRETCH - Flexion, Standing  · Stand with good posture. With an underhand grip on your right / left hand and an overhand grip on the opposite hand, grasp a broomstick or cane so that your hands are a little more than shoulder-width apart.  · Keeping your right / left elbow straight and shoulder muscles relaxed, push the stick with your opposite hand to raise your right / left arm in front of your  body and then overhead. Raise your arm until you feel a stretch in your right / left shoulder, but before you have increased shoulder pain.  · Try to avoid shrugging your right / left shoulder as your arm rises by keeping your shoulder blade tucked down and toward your mid-back spine. Hold __________ seconds.  · Slowly return to the starting position.  Repeat __________ times. Complete this exercise __________ times per day.  STRETCH - Internal Rotation  · Place your right / left hand behind your back, palm-up.  · Throw a towel or belt over your opposite shoulder. Grasp the towel/belt with your right / left hand.  · While keeping an upright posture, gently pull up on the towel/belt until you feel a stretch in the front of your right / left shoulder.  · Avoid shrugging your right / left shoulder as your arm rises by keeping your shoulder blade tucked down and toward your mid-back spine.  · Hold __________. Release the stretch by lowering your opposite hand.  Repeat __________ times. Complete this exercise __________ times per day.  STRETCH - External Rotation and Abduction  · Stagger your stance through a doorframe. It does not matter which foot is forward.  · As instructed by your physician, physical therapist or athletic trainer, place your hands:    And forearms above your head and on the door frame.    And forearms at head-height and on the door frame.      At elbow-height and on the door frame.  · Keeping your head and chest upright and your stomach muscles tight to prevent over-extending your low-back, slowly shift your weight onto your front foot until you feel a stretch across your chest and/or in the front of your shoulders.  · Hold __________ seconds. Shift your weight to your back foot to release the stretch.  Repeat __________ times. Complete this stretch __________ times per day.   STRENGTHENING EXERCISES   These exercises may help you when beginning to rehabilitate your injury. They may resolve your  symptoms with or without further involvement from your physician, physical therapist or athletic trainer. While completing these exercises, remember:   · Muscles can gain both the endurance and the strength needed for everyday activities through controlled exercises.  · Complete these exercises as instructed by your physician, physical therapist or athletic trainer. Progress the resistance and repetitions only as guided.  · You may experience muscle soreness or fatigue, but the pain or discomfort you are trying to eliminate should never worsen during these exercises. If this pain does worsen, stop and make certain you are following the directions exactly. If the pain is still present after adjustments, discontinue the exercise until you can discuss the trouble with your clinician.  · If advised by your physician, during your recovery, avoid activity or exercises which involve actions that place your right / left hand or elbow above your head or behind your back or head. These positions stress the tissues which are trying to heal.  STRENGTH - Scapular Depression and Adduction  · With good posture, sit on a firm chair. Supported your arms in front of you with pillows, arm rests or a table top. Have your elbows in line with the sides of your body.  · Gently draw your shoulder blades down and toward your mid-back spine. Gradually increase the tension without tensing the muscles along the top of your shoulders and the back of your neck.  · Hold for __________ seconds. Slowly release the tension and relax your muscles completely before completing the next repetition.  · After you have practiced this exercise, remove the arm support and complete it in standing as well as sitting.  Repeat __________ times. Complete this exercise __________ times per day.   STRENGTH - External Rotators  · Secure a rubber exercise band/tubing to a fixed object so that it is at the same height as your right / left elbow when you are standing  or sitting on a firm surface.  · Stand or sit so that the secured exercise band/tubing is at your side that is not injured.  · Bend your elbow 90 degrees. Place a folded towel or small pillow under your right / left arm so that your elbow is a few inches away from your side.  · Keeping the tension on the exercise band/tubing, pull it away from your body, as if pivoting on your elbow. Be sure to keep your body steady so that the movement is only coming from your shoulder rotating.  · Hold __________ seconds. Release the tension in a controlled manner as you return to the starting position.  Repeat __________ times. Complete this exercise __________ times per day.   STRENGTH - Supraspinatus  · Stand or sit with good posture. Grasp a __________ weight or an exercise band/tubing so that your hand is "thumbs-up," like when you shake hands.  · Slowly lift your right / left hand from your thigh into the air,   traveling about 30 degrees from straight out at your side. Lift your hand to shoulder height or as far as you can without increasing any shoulder pain. Initially, many people do not lift their hands above shoulder height.  · Avoid shrugging your right / left shoulder as your arm rises by keeping your shoulder blade tucked down and toward your mid-back spine.  · Hold for __________ seconds. Control the descent of your hand as you slowly return to your starting position.  Repeat __________ times. Complete this exercise __________ times per day.   STRENGTH - Shoulder Extensors  · Secure a rubber exercise band/tubing so that it is at the height of your shoulders when you are either standing or sitting on a firm arm-less chair.  · With a thumbs-up grip, grasp an end of the band/tubing in each hand. Straighten your elbows and lift your hands straight in front of you at shoulder height. Step back away from the secured end of band/tubing until it becomes tense.  · Squeezing your shoulder blades together, pull your hands down  to the sides of your thighs. Do not allow your hands to go behind you.  · Hold for __________ seconds. Slowly ease the tension on the band/tubing as you reverse the directions and return to the starting position.  Repeat __________ times. Complete this exercise __________ times per day.   STRENGTH - Scapular Retractors  · Secure a rubber exercise band/tubing so that it is at the height of your shoulders when you are either standing or sitting on a firm arm-less chair.  · With a palm-down grip, grasp an end of the band/tubing in each hand. Straighten your elbows and lift your hands straight in front of you at shoulder height. Step back away from the secured end of band/tubing until it becomes tense.  · Squeezing your shoulder blades together, draw your elbows back as you bend them. Keep your upper arm lifted away from your body throughout the exercise.  · Hold __________ seconds. Slowly ease the tension on the band/tubing as you reverse the directions and return to the starting position.  Repeat __________ times. Complete this exercise __________ times per day.  STRENGTH - Scapular Depressors  · Find a sturdy chair without wheels, such as a from a dining room table.  · Keeping your feet on the floor, lift your bottom from the seat and lock your elbows.  · Keeping your elbows straight, allow gravity to pull your body weight down. Your shoulders will rise toward your ears.  · Raise your body against gravity by drawing your shoulder blades down your back, shortening the distance between your shoulders and ears. Although your feet should always maintain contact with the floor, your feet should progressively support less body weight as you get stronger.  · Hold __________ seconds. In a controlled and slow manner, lower your body weight to begin the next repetition.  Repeat __________ times. Complete this exercise __________ times per day.      This information is not intended to replace advice given to you by your health  care provider. Make sure you discuss any questions you have with your health care provider.     Document Released: 10/28/2005 Document Revised: 01/04/2015 Document Reviewed: 03/28/2009  Elsevier Interactive Patient Education ©2016 [employer]

## 2016-03-16 NOTE — Progress Notes (Signed)
   Subjective:    Patient ID: Marissa Wells, female    DOB: [birth year], 65 y.o.   MRN: [id number]  HPI Patient here today for right shoulder and arm pain that started about 2 weeks ago - 2 days after "blowing leaves".   There was no fall or injury. Pain seems to be in a posterior lateral location. It is worse with abduction.   Patient Active Problem List   Diagnosis Date Noted  . Tobacco use disorder 01/17/2015  . Hyperlipidemia 09/08/2013  . Depression 09/08/2013  . Insomnia 09/08/2013  . HTN (hypertension) 06/12/2013  . Hypothyroidism 06/12/2013   Outpatient Encounter Prescriptions as of 03/16/2016  Medication Sig  . amLODipine (NORVASC) 5 MG tablet Take 1 tablet (5 mg total) by mouth daily.  Marland Kitchen. aspirin 81 MG tablet Take 81 mg by mouth daily.  Marland Kitchen. atorvastatin (LIPITOR) 40 MG tablet Take 1 tablet (40 mg total) by mouth daily.  . Calcium-Magnesium-Vitamin D 600-40-500 MG-MG-UNIT TB24 Take by mouth.  . levothyroxine (SYNTHROID, LEVOTHROID) 50 MCG tablet TAKE ONE TABLET BY MOUTH ONE TIME DAILY BEFORE BREAKFAST  . sertraline (ZOLOFT) 100 MG tablet Take 1 tablet (100 mg total) by mouth daily.   No facility-administered encounter medications on file as of 03/16/2016.      Review of Systems  Constitutional: Negative.   HENT: Negative.   Eyes: Negative.   Respiratory: Negative.   Cardiovascular: Negative.   Gastrointestinal: Negative.   Endocrine: Negative.   Genitourinary: Negative.   Musculoskeletal: Positive for arthralgias (right arm and shoulder pain).  Skin: Negative.   Allergic/Immunologic: Negative.   Neurological: Negative.   Hematological: Negative.   Psychiatric/Behavioral: Negative.        Objective:   Physical Exam  Constitutional: She appears well-developed and well-nourished.  Musculoskeletal:  Right shoulder: Patient has pretty good range of motion but with pain especially when arm gets above shoulder level. Supraspinatus strength internal and external  rotation are good.   BP 110/65 mmHg  Pulse 89  Temp(Src) 96.9 F (36.1 C) (Oral)  Ht 5\' 6"  (1.676 m)  Wt 183 lb (83.008 kg)  BMI 29.55 kg/m2        Assessment & Plan:  1. Shoulder strain, right, initial encounter I think this represents a strain rather than a rotator cuff injury or bursitis. Will try conservative treatment with meloxicam and exercises for 2 weeks and then recheck if no better. Would also consider injection with steroid if no improvement. If no improvement at all consider MRI to rule out nontraumatic rotator cuff tear  Frederica KusterStephen M Miller MD

## 2016-03-27 ENCOUNTER — Other Ambulatory Visit: Payer: Self-pay | Admitting: Family Medicine

## 2016-03-27 ENCOUNTER — Other Ambulatory Visit: Payer: Self-pay | Admitting: Nurse Practitioner

## 2016-04-08 NOTE — Telephone Encounter (Signed)
Pt was seen in office with Dr.Miller 03/16/16.

## 2016-05-11 ENCOUNTER — Encounter: Payer: Self-pay | Admitting: Nurse Practitioner

## 2016-05-11 ENCOUNTER — Ambulatory Visit (INDEPENDENT_AMBULATORY_CARE_PROVIDER_SITE_OTHER): Payer: No Typology Code available for payment source | Admitting: Nurse Practitioner

## 2016-05-11 VITALS — BP 149/85 | HR 78 | Temp 97.6°F | Ht 66.0 in | Wt 182.0 lb

## 2016-05-11 DIAGNOSIS — Z87891 Personal history of nicotine dependence: Secondary | ICD-10-CM

## 2016-05-11 DIAGNOSIS — Z6829 Body mass index (BMI) 29.0-29.9, adult: Secondary | ICD-10-CM | POA: Insufficient documentation

## 2016-05-11 DIAGNOSIS — G4700 Insomnia, unspecified: Secondary | ICD-10-CM

## 2016-05-11 DIAGNOSIS — E039 Hypothyroidism, unspecified: Secondary | ICD-10-CM

## 2016-05-11 DIAGNOSIS — Z1212 Encounter for screening for malignant neoplasm of rectum: Secondary | ICD-10-CM

## 2016-05-11 DIAGNOSIS — E785 Hyperlipidemia, unspecified: Secondary | ICD-10-CM | POA: Diagnosis not present

## 2016-05-11 DIAGNOSIS — I10 Essential (primary) hypertension: Secondary | ICD-10-CM

## 2016-05-11 DIAGNOSIS — F329 Major depressive disorder, single episode, unspecified: Secondary | ICD-10-CM

## 2016-05-11 DIAGNOSIS — F32A Depression, unspecified: Secondary | ICD-10-CM

## 2016-05-11 MED ORDER — ATORVASTATIN CALCIUM 40 MG PO TABS: 40.0000 mg | ORAL_TABLET | Freq: Every day | ORAL | Status: DC

## 2016-05-11 MED ORDER — LEVOTHYROXINE SODIUM 50 MCG PO TABS: ORAL_TABLET | ORAL | Status: DC

## 2016-05-11 MED ORDER — SERTRALINE HCL 100 MG PO TABS
100.0000 mg | ORAL_TABLET | Freq: Every day | ORAL | Status: DC
Start: 2016-05-11 — End: 2016-12-17

## 2016-05-11 MED ORDER — ZOLPIDEM TARTRATE 10 MG PO TABS: 10.0000 mg | ORAL_TABLET | Freq: Every evening | ORAL | Status: DC | PRN

## 2016-05-11 MED ORDER — AMLODIPINE BESYLATE 5 MG PO TABS: 5.0000 mg | ORAL_TABLET | Freq: Every day | ORAL | Status: DC

## 2016-05-11 NOTE — Progress Notes (Signed)
Subjective:    Patient ID: Marissa Wells, female    DOB: [birth year], 65 y.o.   MRN: [id number]   Patient here today for follow up of chronic medical problems.  Outpatient Encounter Prescriptions as of 05/11/2016  Medication Sig  . amLODipine (NORVASC) 5 MG tablet Take 1 tablet (5 mg total) by mouth daily.  Marland Kitchen aspirin 81 MG tablet Take 81 mg by mouth daily.  Marland Kitchen atorvastatin (LIPITOR) 40 MG tablet Take 1 tablet (40 mg total) by mouth daily.  . Calcium-Magnesium-Vitamin D 511-02-111 MG-MG-UNIT TB24 Take by mouth.  . levothyroxine (SYNTHROID, LEVOTHROID) 50 MCG tablet TAKE 1 TABLET ONCE A DAY BEFORE BREAKFAST  . sertraline (ZOLOFT) 100 MG tablet Take 1 tablet (100 mg total) by mouth daily.  . [DISCONTINUED] meloxicam (MOBIC) 7.5 MG tablet Take 1 tablet (7.5 mg total) by mouth daily.   No facility-administered encounter medications on file as of 05/11/2016.    Hyperlipidemia This is a chronic problem. The current episode started more than 1 year ago. Recent lipid tests were reviewed and are variable. Pertinent negatives include no chest pain or myalgias. Current antihyperlipidemic treatment includes statins. The current treatment provides moderate improvement of lipids. Compliance problems include adherence to diet and adherence to exercise.  Risk factors for coronary artery disease include dyslipidemia, hypertension, obesity and post-menopausal.  Hypertension This is a chronic problem. The current episode started more than 1 year ago. The problem is unchanged. The problem is controlled. Pertinent negatives include no chest pain, neck pain or palpitations. Risk factors for coronary artery disease include dyslipidemia, obesity and post-menopausal state. Past treatments include calcium channel blockers. The current treatment provides moderate improvement. Compliance problems include diet and exercise.  Hypertensive end-organ damage includes a thyroid problem.  Thyroid Problem Visit type:  hypothyroidism. Symptoms include cold intolerance. Patient reports no constipation, heat intolerance, palpitations, tremors or visual change. The symptoms have been stable. Her past medical history is significant for hyperlipidemia.  depression zoloft working well to keep her calm and focused insomnia Lorrin Mais works well but patient has been out of med for at least a week.  Review of Systems  Cardiovascular: Negative for chest pain and palpitations.  Gastrointestinal: Negative for constipation.  Endocrine: Positive for cold intolerance. Negative for heat intolerance.  Musculoskeletal: Negative for myalgias and neck pain.  Neurological: Negative for tremors.  All other systems reviewed and are negative.      Objective:   Physical Exam  Constitutional: She is oriented to person, place, and time. She appears well-developed and well-nourished.  HENT:  Nose: Nose normal.  Mouth/Throat: Oropharynx is clear and moist.  Eyes: EOM are normal.  Neck: Trachea normal, normal range of motion and full passive range of motion without pain. Neck supple. No JVD present. Carotid bruit is not present. No thyromegaly present.  Cardiovascular: Normal rate, regular rhythm, normal heart sounds and intact distal pulses.  Exam reveals no gallop and no friction rub.   No murmur heard. Pulmonary/Chest: Effort normal and breath sounds normal.  Dry cough  Abdominal: Soft. Bowel sounds are normal. She exhibits no distension and no mass. There is no tenderness.  Musculoskeletal: Normal range of motion.  Lymphadenopathy:    She has no cervical adenopathy.  Neurological: She is alert and oriented to person, place, and time. She has normal reflexes.  Skin: Skin is warm and dry.  Psychiatric: She has a normal mood and affect. Her behavior is normal. Judgment and thought content normal.    BP 149/85 mmHg  Pulse  78  Temp(Src) 97.6 F (36.4 C) (Oral)  Ht '5\' 6"'  (1.676 m)  Wt 182 lb (82.555 kg)  BMI 29.39  kg/m2      Assessment & Plan:  1. Essential hypertension Do not add salt to diet - CMP14+EGFR - amLODipine (NORVASC) 5 MG tablet; Take 1 tablet (5 mg total) by mouth daily.  Dispense: 90 tablet; Refill: 1  2. Hypothyroidism, unspecified hypothyroidism type - levothyroxine (SYNTHROID, LEVOTHROID) 50 MCG tablet; TAKE 1 TABLET ONCE A DAY BEFORE BREAKFAST  Dispense: 34 tablet; Refill: 6 - Thyroid Panel With TSH  3. Hyperlipidemia Low fat diet - Lipid panel - atorvastatin (LIPITOR) 40 MG tablet; Take 1 tablet (40 mg total) by mouth daily.  Dispense: 90 tablet; Refill: 1  4. Depression Stress management - sertraline (ZOLOFT) 100 MG tablet; Take 1 tablet (100 mg total) by mouth daily.  Dispense: 90 tablet; Refill: 1  5. Insomnia Bedtime ritual - zolpidem (AMBIEN) 10 MG tablet; Take 1 tablet (10 mg total) by mouth at bedtime as needed for sleep.  Dispense: 30 tablet; Refill: 2  6. Former smoker Do not start smoking again  7. BMI 29.0-29.9,adult Discussed diet and exercise for person with BMI >25 Will recheck weight in 3-6 months   8. Screening for malignant neoplasm of the rectum - Fecal occult blood, imunochemical; Future    Labs pending Health maintenance reviewed Diet and exercise encouraged Continue all meds Follow up  In 6 months   [place], FNP

## 2016-05-11 NOTE — Patient Instructions (Signed)
Insomnia Insomnia is a sleep disorder that makes it difficult to fall asleep or to stay asleep. Insomnia can cause tiredness (fatigue), low energy, difficulty concentrating, mood swings, and poor performance at work or school.  There are three different ways to classify insomnia:  Difficulty falling asleep.  Difficulty staying asleep.  Waking up too early in the morning. Any type of insomnia can be long-term (chronic) or short-term (acute). Both are common. Short-term insomnia usually lasts for three months or less. Chronic insomnia occurs at least three times a week for longer than three months. CAUSES  Insomnia may be caused by another condition, situation, or substance, such as:  Anxiety.  Certain medicines.  Gastroesophageal reflux disease (GERD) or other gastrointestinal conditions.  Asthma or other breathing conditions.  Restless legs syndrome, sleep apnea, or other sleep disorders.  Chronic pain.  Menopause. This may include hot flashes.  Stroke.  Abuse of alcohol, tobacco, or illegal drugs.  Depression.  Caffeine.   Neurological disorders, such as Alzheimer disease.  An overactive thyroid (hyperthyroidism). The cause of insomnia may not be known. RISK FACTORS Risk factors for insomnia include:  Gender. Women are more commonly affected than men.  Age. Insomnia is more common as you get older.  Stress. This may involve your professional or personal life.  Income. Insomnia is more common in people with lower income.  Lack of exercise.   Irregular work schedule or night shifts.  Traveling between different time zones. SIGNS AND SYMPTOMS If you have insomnia, trouble falling asleep or trouble staying asleep is the main symptom. This may lead to other symptoms, such as:  Feeling fatigued.  Feeling nervous about going to sleep.  Not feeling rested in the morning.  Having trouble concentrating.  Feeling irritable, anxious, or depressed. TREATMENT   Treatment for insomnia depends on the cause. If your insomnia is caused by an underlying condition, treatment will focus on addressing the condition. Treatment may also include:   Medicines to help you sleep.  Counseling or therapy.  Lifestyle adjustments. HOME CARE INSTRUCTIONS   Take medicines only as directed by your health care provider.  Keep regular sleeping and waking hours. Avoid naps.  Keep a sleep diary to help you and your health care provider figure out what could be causing your insomnia. Include:   When you sleep.  When you wake up during the night.  How well you sleep.   How rested you feel the next day.  Any side effects of medicines you are taking.  What you eat and drink.   Make your bedroom a comfortable place where it is easy to fall asleep:  Put up shades or special blackout curtains to block light from outside.  Use a white noise machine to block noise.  Keep the temperature cool.   Exercise regularly as directed by your health care provider. Avoid exercising right before bedtime.  Use relaxation techniques to manage stress. Ask your health care provider to suggest some techniques that may work well for you. These may include:  Breathing exercises.  Routines to release muscle tension.  Visualizing peaceful scenes.  Cut back on alcohol, caffeinated beverages, and cigarettes, especially close to bedtime. These can disrupt your sleep.  Do not overeat or eat spicy foods right before bedtime. This can lead to digestive discomfort that can make it hard for you to sleep.  Limit screen use before bedtime. This includes:  Watching TV.  Using your smartphone, tablet, and computer.  Stick to a routine. This   can help you fall asleep faster. Try to do a quiet activity, brush your teeth, and go to bed at the same time each night.  Get out of bed if you are still awake after 15 minutes of trying to sleep. Keep the lights down, but try reading or  doing a quiet activity. When you feel sleepy, go back to bed.  Make sure that you drive carefully. Avoid driving if you feel very sleepy.  Keep all follow-up appointments as directed by your health care provider. This is important. SEEK MEDICAL CARE IF:   You are tired throughout the day or have trouble in your daily routine due to sleepiness.  You continue to have sleep problems or your sleep problems get worse. SEEK IMMEDIATE MEDICAL CARE IF:   You have serious thoughts about hurting yourself or someone else.   This information is not intended to replace advice given to you by your health care provider. Make sure you discuss any questions you have with your health care provider.   Document Released: 12/11/2000 Document Revised: 09/04/2015 Document Reviewed: 09/14/2014 Elsevier Interactive Patient Education 2016 [employer]  

## 2016-05-12 ENCOUNTER — Other Ambulatory Visit: Payer: Self-pay | Admitting: Nurse Practitioner

## 2016-05-12 DIAGNOSIS — R7989 Other specified abnormal findings of blood chemistry: Secondary | ICD-10-CM

## 2016-05-12 DIAGNOSIS — R945 Abnormal results of liver function studies: Principal | ICD-10-CM

## 2016-05-12 LAB — LIPID PANEL
CHOL/HDL RATIO: 2.7 ratio (ref 0.0–4.4)
Cholesterol, Total: 111 mg/dL (ref 100–199)
HDL: 41 mg/dL (ref >39)
LDL CALC: 56 mg/dL (ref 0–99)
Triglycerides: 72 mg/dL (ref 0–149)
VLDL Cholesterol Cal: 14 mg/dL (ref 5–40)

## 2016-05-12 LAB — CMP14+EGFR
A/G RATIO: 1 — AB (ref 1.2–2.2)
ALT: 859 IU/L — AB (ref 0–32)
AST: 926 IU/L — AB (ref 0–40)
Albumin: 3.7 g/dL (ref 3.6–4.8)
Alkaline Phosphatase: 609 IU/L — ABNORMAL HIGH (ref 39–117)
BILIRUBIN TOTAL: 1.8 mg/dL — AB (ref 0.0–1.2)
BUN/Creatinine Ratio: 13 (ref 12–28)
BUN: 11 mg/dL (ref 8–27)
CO2: 18 mmol/L (ref 18–29)
Calcium: 9.2 mg/dL (ref 8.7–10.3)
Chloride: 106 mmol/L (ref 96–106)
Creatinine, Ser: 0.84 mg/dL (ref 0.57–1.00)
GFR, EST AFRICAN AMERICAN: 86 mL/min/{1.73_m2} (ref >59)
GFR, EST NON AFRICAN AMERICAN: 75 mL/min/{1.73_m2} (ref >59)
Globulin, Total: 3.8 g/dL (ref 1.5–4.5)
Glucose: 95 mg/dL (ref 65–99)
Potassium: 4.3 mmol/L (ref 3.5–5.2)
Sodium: 144 mmol/L (ref 134–144)
Total Protein: 7.5 g/dL (ref 6.0–8.5)

## 2016-05-12 LAB — THYROID PANEL WITH TSH
Free Thyroxine Index: 2.2 (ref 1.2–4.9)
T3 Uptake Ratio: 18 % — ABNORMAL LOW (ref 24–39)
T4 TOTAL: 12.2 ug/dL — AB (ref 4.5–12.0)
TSH: 2.1 u[IU]/mL (ref 0.450–4.500)

## 2016-05-13 LAB — LIPASE: Lipase: 59 U/L (ref 0–59)

## 2016-05-13 LAB — HEPATITIS PANEL, ACUTE
HEP A IGM: NEGATIVE
HEP B S AG: NEGATIVE
HEP C VIRUS AB: 0.2 {s_co_ratio} (ref 0.0–0.9)
Hep B C IgM: NEGATIVE

## 2016-05-13 LAB — SPECIMEN STATUS REPORT

## 2016-05-13 LAB — AMYLASE: AMYLASE: 147 U/L — AB (ref 31–124)

## 2016-05-15 ENCOUNTER — Telehealth: Payer: Self-pay | Admitting: Nurse Practitioner

## 2016-05-20 ENCOUNTER — Ambulatory Visit (HOSPITAL_COMMUNITY)
Admission: RE | Admit: 2016-05-20 | Discharge: 2016-05-20 | Disposition: A | Discharge status: Home / Self Care | Payer: No Typology Code available for payment source | Source: Ambulatory Visit | Attending: Nurse Practitioner | Admitting: Nurse Practitioner

## 2016-05-20 DIAGNOSIS — R93421 Abnormal radiologic findings on diagnostic imaging of right kidney: Secondary | ICD-10-CM | POA: Diagnosis not present

## 2016-05-20 DIAGNOSIS — K838 Other specified diseases of biliary tract: Secondary | ICD-10-CM | POA: Diagnosis not present

## 2016-05-20 DIAGNOSIS — R7989 Other specified abnormal findings of blood chemistry: Secondary | ICD-10-CM | POA: Diagnosis present

## 2016-05-20 DIAGNOSIS — Z9049 Acquired absence of other specified parts of digestive tract: Secondary | ICD-10-CM | POA: Diagnosis not present

## 2016-05-20 DIAGNOSIS — R945 Abnormal results of liver function studies: Secondary | ICD-10-CM

## 2016-12-17 ENCOUNTER — Other Ambulatory Visit: Payer: Self-pay | Admitting: Nurse Practitioner

## 2016-12-17 DIAGNOSIS — I10 Essential (primary) hypertension: Secondary | ICD-10-CM

## 2016-12-17 DIAGNOSIS — F329 Major depressive disorder, single episode, unspecified: Secondary | ICD-10-CM

## 2016-12-17 DIAGNOSIS — F32A Depression, unspecified: Secondary | ICD-10-CM

## 2016-12-18 NOTE — Telephone Encounter (Signed)
Can you call pt to set up appt next week with MMM or me for follow up of elevated LFTs? Never came back for repeat labs, supposed to be seen 6 mo ago. I already talked with pt, she knows about need to come in. I did send in refills.

## 2017-04-23 ENCOUNTER — Ambulatory Visit: Payer: No Typology Code available for payment source | Admitting: Physician Assistant

## 2017-04-23 ENCOUNTER — Ambulatory Visit (INDEPENDENT_AMBULATORY_CARE_PROVIDER_SITE_OTHER): Payer: No Typology Code available for payment source | Admitting: Nurse Practitioner

## 2017-04-23 ENCOUNTER — Encounter: Payer: Self-pay | Admitting: Nurse Practitioner

## 2017-04-23 VITALS — BP 128/83 | HR 88 | Temp 96.8°F | Ht 66.0 in | Wt 202.0 lb

## 2017-04-23 DIAGNOSIS — N3000 Acute cystitis without hematuria: Secondary | ICD-10-CM

## 2017-04-23 DIAGNOSIS — R319 Hematuria, unspecified: Secondary | ICD-10-CM | POA: Diagnosis not present

## 2017-04-23 LAB — URINALYSIS, COMPLETE
BILIRUBIN UA: NEGATIVE
Glucose, UA: NEGATIVE
KETONES UA: NEGATIVE
Nitrite, UA: NEGATIVE
SPEC GRAV UA: 1.02 (ref 1.005–1.030)
UUROB: 0.2 mg/dL (ref 0.2–1.0)
pH, UA: 5 (ref 5.0–7.5)

## 2017-04-23 LAB — MICROSCOPIC EXAMINATION
Renal Epithel, UA: NONE SEEN /hpf
WBC, UA: >30 /hpf — AB (ref 0–?)

## 2017-04-23 MED ORDER — CIPROFLOXACIN HCL 500 MG PO TABS: 500.0000 mg | ORAL_TABLET | Freq: Two times a day (BID) | ORAL | 0 refills | Status: DC

## 2017-04-23 NOTE — Progress Notes (Signed)
   Subjective:    Patient ID: Marissa Wells, female    DOB: [birth year], 66 y.o.   MRN: [id number]  HPI  Patient in today c/oo urinary frequency, urgency and dysuria- actually started in Monday and has increasingly got worse. Now has mild left flank pain. Saw some blood on tissue when sh e wiped this morning.  Review of Systems  Constitutional: Negative.  Negative for fever.  Respiratory: Negative.   Cardiovascular: Negative.   Gastrointestinal: Positive for abdominal pain (suprapubic pain ).  Genitourinary: Positive for dysuria, flank pain, frequency, hematuria and urgency.  Neurological: Negative.   Psychiatric/Behavioral: Negative.   All other systems reviewed and are negative.      Objective:   Physical Exam  Constitutional: She is oriented to person, place, and time. She appears well-developed and well-nourished. No distress.  Cardiovascular: Normal rate and regular rhythm.   Pulmonary/Chest: Effort normal.  Abdominal: Soft. There is tenderness (suprapubic presure on palpation).  Genitourinary:  Genitourinary Comments: NO CVA tenderness  Neurological: She is alert and oriented to person, place, and time.  Skin: Skin is warm.  Psychiatric: She has a normal mood and affect.   BP 128/83   Pulse 88   Temp (!) 96.8 F (36 C) (Oral)   Ht  (1.676 m)   Wt 202 lb (91.6 kg)   BMI 32.60 kg/m        Assessment & Plan:   1. Hematuria, unspecified type   2. Acute cystitis without hematuria    Scheduled Meds: Continuous Infusions: PRN Meds:. Take medication as prescribe Cotton underwear Take shower not bath Cranberry juice, yogurt Force fluids AZO over the counter X2 days Culture pending RTO prn  Mary-Margaret Daphine Deutscher, FNP

## 2017-04-23 NOTE — Patient Instructions (Signed)
Asymptomatic Bacteriuria Asymptomatic bacteriuria is the presence of a large number of bacteria in the urine without the usual symptoms of burning or frequent urination. What are the causes? This condition is caused by an increase in bacteria in the urine. This increase can be caused by:  Bacteria entering the urinary tract, such as during sex.  A blockage in the urinary tract, such as from kidney stones or a tumor.  Bladder problems that prevent the bladder from emptying.  What increases the risk? You are more likely to develop this condition if:  You have diabetes mellitus.  You are an elderly adult, especially if you are also in a long-term care facility.  You are pregnant and in the first trimester.  You have kidney stones.  You are female.  You have had a kidney transplant.  You have a leaky kidney tube valve (reflux).  You had a urinary catheter for a long period of time.  What are the signs or symptoms? There are no symptoms of this condition. How is this diagnosed? This condition is diagnosed with a urine test. Because this condition does not cause symptoms, it is usually diagnosed when a urine sample is taken to treat or diagnose another condition, such as pregnancy or kidney problems. Most women who are in their first trimester of pregnancy are screened for asymptomatic bacteriuria. How is this treated? Usually, treatment is not needed for this condition. Treating the condition can lead to other problems, such as a yeast infection or the growth of bacteria that do not respond to treatment (antibiotic-resistant bacteria). Some people, such as pregnant women and people with kidney transplants, do need treatment with antibiotic medicines to prevent kidney infection (pyelonephritis). In pregnant women, kidney infection can lead to premature labor, fetal growth restriction, or newborn death. Follow these instructions at home: Medicines  Take over-the-counter and  prescription medicines only as told by your health care provider.  If you were prescribed an antibiotic medicine, take it as told by your health care provider. Do not stop taking the antibiotic even if you start to feel better. General instructions  Monitor your condition for any changes.  Drink enough fluid to keep your urine clear or pale yellow.  Go to the bathroom more often to keep your bladder empty.  If you are female, keep the area around your vagina and rectum clean. Wipe yourself from front to back after urinating.  Keep all follow-up visits as told by your health care provider. This is important. Contact a health care provider if:  You notice any new symptoms, such as back pain or burning while urinating. Get help right away if:  You develop signs of an infection such as: ? A burning sensation when you urinate. ? Have pain when you urinate. ? Develop an intense need to urinate. ? Urinating more frequently. ? Back pain or pelvic pain. ? Fever or chills.  You have blood in your urine.  Your urine becomes discolored or cloudy.  Your urine smells bad.  You have severe pain that cannot be controlled with medicine. Summary  Asymptomatic bacteriuria is the presence of a large number of bacteria in the urine without the usual symptoms of burning or frequent urination.  Usually, treatment is not needed for this condition. Treating the condition can lead to other problems, such as too much yeast and the growth of antibiotic-resistant bacteria.  Some people, such as pregnant women and people with kidney transplants, do need treatment with antibiotic medicines to prevent   kidney infection (pyelonephritis).  If you were prescribed an antibiotic medicine, take it as told by your health care provider. Do not stop taking the antibiotic even if you start to feel better. This information is not intended to replace advice given to you by your health care provider. Make sure you  discuss any questions you have with your health care provider. Document Released: 12/14/2005 Document Revised: 12/08/2016 Document Reviewed: 12/08/2016 Elsevier Interactive Patient Education  2017 [employer] Inc.  

## 2017-04-26 ENCOUNTER — Telehealth: Payer: Self-pay | Admitting: Nurse Practitioner

## 2017-04-27 LAB — URINE CULTURE

## 2017-04-27 NOTE — Telephone Encounter (Signed)
Patient aware of results.

## 2017-05-27 ENCOUNTER — Telehealth: Payer: Self-pay | Admitting: Nurse Practitioner

## 2017-06-04 ENCOUNTER — Other Ambulatory Visit: Payer: Self-pay | Admitting: Nurse Practitioner

## 2017-06-04 DIAGNOSIS — E039 Hypothyroidism, unspecified: Secondary | ICD-10-CM

## 2017-06-18 ENCOUNTER — Other Ambulatory Visit: Payer: Self-pay | Admitting: Pediatrics

## 2017-06-18 DIAGNOSIS — I10 Essential (primary) hypertension: Secondary | ICD-10-CM

## 2017-07-20 ENCOUNTER — Other Ambulatory Visit: Payer: Self-pay | Admitting: Pediatrics

## 2017-07-20 ENCOUNTER — Other Ambulatory Visit: Payer: Self-pay | Admitting: Nurse Practitioner

## 2017-07-20 DIAGNOSIS — F32A Depression, unspecified: Secondary | ICD-10-CM

## 2017-07-20 DIAGNOSIS — F329 Major depressive disorder, single episode, unspecified: Secondary | ICD-10-CM

## 2017-07-20 DIAGNOSIS — E039 Hypothyroidism, unspecified: Secondary | ICD-10-CM

## 2017-07-21 NOTE — Telephone Encounter (Signed)
Last thyroid 05/11/16

## 2017-07-22 NOTE — Telephone Encounter (Signed)
LMOVM NTBS before next refill 

## 2017-07-22 NOTE — Telephone Encounter (Signed)
Last refill without being seen 

## 2017-08-17 ENCOUNTER — Ambulatory Visit (INDEPENDENT_AMBULATORY_CARE_PROVIDER_SITE_OTHER): Payer: No Typology Code available for payment source | Admitting: Nurse Practitioner

## 2017-08-17 ENCOUNTER — Encounter: Payer: Self-pay | Admitting: Nurse Practitioner

## 2017-08-17 VITALS — BP 133/77 | HR 74 | Temp 97.4°F | Ht 66.0 in | Wt 203.0 lb

## 2017-08-17 DIAGNOSIS — E039 Hypothyroidism, unspecified: Secondary | ICD-10-CM | POA: Diagnosis not present

## 2017-08-17 DIAGNOSIS — F3342 Major depressive disorder, recurrent, in full remission: Secondary | ICD-10-CM

## 2017-08-17 DIAGNOSIS — E785 Hyperlipidemia, unspecified: Secondary | ICD-10-CM | POA: Diagnosis not present

## 2017-08-17 DIAGNOSIS — Z6829 Body mass index (BMI) 29.0-29.9, adult: Secondary | ICD-10-CM | POA: Diagnosis not present

## 2017-08-17 DIAGNOSIS — G4700 Insomnia, unspecified: Secondary | ICD-10-CM | POA: Diagnosis not present

## 2017-08-17 DIAGNOSIS — F5101 Primary insomnia: Secondary | ICD-10-CM

## 2017-08-17 DIAGNOSIS — F3340 Major depressive disorder, recurrent, in remission, unspecified: Secondary | ICD-10-CM

## 2017-08-17 DIAGNOSIS — I10 Essential (primary) hypertension: Secondary | ICD-10-CM | POA: Diagnosis not present

## 2017-08-17 MED ORDER — ZOLPIDEM TARTRATE 10 MG PO TABS: 10.0000 mg | ORAL_TABLET | Freq: Every evening | ORAL | 2 refills | Status: DC | PRN

## 2017-08-17 MED ORDER — SERTRALINE HCL 100 MG PO TABS: 100.0000 mg | ORAL_TABLET | Freq: Every day | ORAL | 1 refills | Status: DC

## 2017-08-17 MED ORDER — LEVOTHYROXINE SODIUM 50 MCG PO TABS: ORAL_TABLET | ORAL | 1 refills | Status: DC

## 2017-08-17 MED ORDER — AMLODIPINE BESYLATE 5 MG PO TABS: 5.0000 mg | ORAL_TABLET | Freq: Every day | ORAL | 1 refills | Status: DC

## 2017-08-17 NOTE — Patient Instructions (Signed)
DASH Eating Plan DASH stands for "Dietary Approaches to Stop Hypertension." The DASH eating plan is a healthy eating plan that has been shown to reduce high blood pressure (hypertension). It may also reduce your risk for type 2 diabetes, heart disease, and stroke. The DASH eating plan may also help with weight loss. What are tips for following this plan? General guidelines  Avoid eating more than 2,300 mg (milligrams) of salt (sodium) a day. If you have hypertension, you may need to reduce your sodium intake to 1,500 mg a day.  Limit alcohol intake to no more than 1 drink a day for nonpregnant women and 2 drinks a day for men. One drink equals 12 oz of beer, 5 oz of wine, or 1 oz of hard liquor.  Work with your health care provider to maintain a healthy body weight or to lose weight. Ask what an ideal weight is for you.  Get at least 30 minutes of exercise that causes your heart to beat faster (aerobic exercise) most days of the week. Activities may include walking, swimming, or biking.  Work with your health care provider or diet and nutrition specialist (dietitian) to adjust your eating plan to your individual calorie needs. Reading food labels  Check food labels for the amount of sodium per serving. Choose foods with less than 5 percent of the Daily Value of sodium. Generally, foods with less than 300 mg of sodium per serving fit into this eating plan.  To find whole grains, look for the word "whole" as the first word in the ingredient list. Shopping  Buy products labeled as "low-sodium" or "no salt added."  Buy fresh foods. Avoid canned foods and premade or frozen meals. Cooking  Avoid adding salt when cooking. Use salt-free seasonings or herbs instead of table salt or sea salt. Check with your health care provider or pharmacist before using salt substitutes.  Do not fry foods. Cook foods using healthy methods such as baking, boiling, grilling, and broiling instead.  Cook with  heart-healthy oils, such as olive, canola, soybean, or sunflower oil. Meal planning   Eat a balanced diet that includes: ? 5 or more servings of fruits and vegetables each day. At each meal, try to fill half of your plate with fruits and vegetables. ? Up to 6-8 servings of whole grains each day. ? Less than 6 oz of lean meat, poultry, or fish each day. A 3-oz serving of meat is about the same size as a deck of cards. One egg equals 1 oz. ? 2 servings of low-fat dairy each day. ? A serving of nuts, seeds, or beans 5 times each week. ? Heart-healthy fats. Healthy fats called Omega-3 fatty acids are found in foods such as flaxseeds and coldwater fish, like sardines, salmon, and mackerel.  Limit how much you eat of the following: ? Canned or prepackaged foods. ? Food that is high in trans fat, such as fried foods. ? Food that is high in saturated fat, such as fatty meat. ? Sweets, desserts, sugary drinks, and other foods with added sugar. ? Full-fat dairy products.  Do not salt foods before eating.  Try to eat at least 2 vegetarian meals each week.  Eat more home-cooked food and less restaurant, buffet, and fast food.  When eating at a restaurant, ask that your food be prepared with less salt or no salt, if possible. What foods are recommended? The items listed may not be a complete list. Talk with your dietitian about what   dietary choices are best for you. Grains Whole-grain or whole-wheat bread. Whole-grain or whole-wheat pasta. Brown rice. Oatmeal. Quinoa. Bulgur. Whole-grain and low-sodium cereals. Pita bread. Low-fat, low-sodium crackers. Whole-wheat flour tortillas. Vegetables Fresh or frozen vegetables (raw, steamed, roasted, or grilled). Low-sodium or reduced-sodium tomato and vegetable juice. Low-sodium or reduced-sodium tomato sauce and tomato paste. Low-sodium or reduced-sodium canned vegetables. Fruits All fresh, dried, or frozen fruit. Canned fruit in natural juice (without  added sugar). Meat and other protein foods Skinless chicken or turkey. Ground chicken or turkey. Pork with fat trimmed off. Fish and seafood. Egg whites. Dried beans, peas, or lentils. Unsalted nuts, nut butters, and seeds. Unsalted canned beans. Lean cuts of beef with fat trimmed off. Low-sodium, lean deli meat. Dairy Low-fat (1%) or fat-free (skim) milk. Fat-free, low-fat, or reduced-fat cheeses. Nonfat, low-sodium ricotta or cottage cheese. Low-fat or nonfat yogurt. Low-fat, low-sodium cheese. Fats and oils Soft margarine without trans fats. Vegetable oil. Low-fat, reduced-fat, or light mayonnaise and salad dressings (reduced-sodium). Canola, safflower, olive, soybean, and sunflower oils. Avocado. Seasoning and other foods Herbs. Spices. Seasoning mixes without salt. Unsalted popcorn and pretzels. Fat-free sweets. What foods are not recommended? The items listed may not be a complete list. Talk with your dietitian about what dietary choices are best for you. Grains Baked goods made with fat, such as croissants, muffins, or some breads. Dry pasta or rice meal packs. Vegetables Creamed or fried vegetables. Vegetables in a cheese sauce. Regular canned vegetables (not low-sodium or reduced-sodium). Regular canned tomato sauce and paste (not low-sodium or reduced-sodium). Regular tomato and vegetable juice (not low-sodium or reduced-sodium). Pickles. Olives. Fruits Canned fruit in a light or heavy syrup. Fried fruit. Fruit in cream or butter sauce. Meat and other protein foods Fatty cuts of meat. Ribs. Fried meat. Bacon. Sausage. Bologna and other processed lunch meats. Salami. Fatback. Hotdogs. Bratwurst. Salted nuts and seeds. Canned beans with added salt. Canned or smoked fish. Whole eggs or egg yolks. Chicken or turkey with skin. Dairy Whole or 2% milk, cream, and half-and-half. Whole or full-fat cream cheese. Whole-fat or sweetened yogurt. Full-fat cheese. Nondairy creamers. Whipped toppings.  Processed cheese and cheese spreads. Fats and oils Butter. Stick margarine. Lard. Shortening. Ghee. Bacon fat. Tropical oils, such as coconut, palm kernel, or palm oil. Seasoning and other foods Salted popcorn and pretzels. Onion salt, garlic salt, seasoned salt, table salt, and sea salt. Worcestershire sauce. Tartar sauce. Barbecue sauce. Teriyaki sauce. Soy sauce, including reduced-sodium. Steak sauce. Canned and packaged gravies. Fish sauce. Oyster sauce. Cocktail sauce. Horseradish that you find on the shelf. Ketchup. Mustard. Meat flavorings and tenderizers. Bouillon cubes. Hot sauce and Tabasco sauce. Premade or packaged marinades. Premade or packaged taco seasonings. Relishes. Regular salad dressings. Where to find more information:  National Heart, Lung, and Blood Institute: www.nhlbi.nih.gov  American Heart Association: www.heart.org Summary  The DASH eating plan is a healthy eating plan that has been shown to reduce high blood pressure (hypertension). It may also reduce your risk for type 2 diabetes, heart disease, and stroke.  With the DASH eating plan, you should limit salt (sodium) intake to 2,300 mg a day. If you have hypertension, you may need to reduce your sodium intake to 1,500 mg a day.  When on the DASH eating plan, aim to eat more fresh fruits and vegetables, whole grains, lean proteins, low-fat dairy, and heart-healthy fats.  Work with your health care provider or diet and nutrition specialist (dietitian) to adjust your eating plan to your individual   calorie needs. This information is not intended to replace advice given to you by your health care provider. Make sure you discuss any questions you have with your health care provider. Document Released: 12/03/2011 Document Revised: 12/07/2016 Document Reviewed: 12/07/2016 Elsevier Interactive Patient Education  2017 Elsevier Inc.  

## 2017-08-17 NOTE — Progress Notes (Signed)
Subjective:    Patient ID: Marissa Wells, female    DOB: [birth year], 66 y.o.   MRN: [id number]  HPI Leisa Gault is here today for follow up of chronic medical problem.  Outpatient Encounter Prescriptions as of 08/17/2017  Medication Sig  . amLODipine (NORVASC) 5 MG tablet TAKE 1 TABLET DAILY  . aspirin 81 MG tablet Take 81 mg by mouth daily.  . Calcium-Magnesium-Vitamin D [id number] MG-MG-UNIT TB24 Take by mouth.  . ciprofloxacin (CIPRO) 500 MG tablet Take 1 tablet (500 mg total) by mouth 2 (two) times daily.  Marland Kitchen levothyroxine (SYNTHROID, LEVOTHROID) 50 MCG tablet TAKE 1 TABLET ONCE A DAY BEFORE BREAKFAST  . sertraline (ZOLOFT) 100 MG tablet TAKE 1 TABLET DAILY  . zolpidem (AMBIEN) 10 MG tablet Take 1 tablet (10 mg total) by mouth at bedtime as needed for sleep.   No facility-administered encounter medications on file as of 08/17/2017.     1. Essential hypertension  Blood pressure managed with amlodipine.  Does not check blood pressure at home.  2. Hypothyroidism, unspecified type  Patient taking levothyroxine.  Routine lab monitoring.  3. Hyperlipidemia, unspecified hyperlipidemia type Managed with diet and exercise.  Routine lab monitoring.   4. Recurrent major depressive disorder, in remission ([place]) Symptoms managed with sertraline.  No current issues.  Depression screen [facility] 2/9 08/17/2017 04/23/2017 05/11/2016 03/16/2016 01/17/2015  Decreased Interest 0 0 0 0 0  Down, Depressed, Hopeless 0 0 0 0 0  PHQ - 2 Score 0 0 0 0 0     5. Insomnia, unspecified type  Patient taking Ambien as needed for sleep.  Patient has been trying not to use, but feels she needs a refill at this time.  6. BMI 29.0-29.9,adult  No significant weight gain or loss.    New complaints: None today.  Social history:    Review of Systems  Constitutional: Negative for activity change.  Respiratory: Negative for cough and shortness of breath.   Cardiovascular: Negative for chest pain and  palpitations.  Neurological: Negative for dizziness and light-headedness.  All other systems reviewed and are negative.      Objective:   Physical Exam  Constitutional: She is oriented to person, place, and time. She appears well-developed and well-nourished. No distress.  HENT:  Head: Normocephalic.  Right Ear: External ear normal.  Left Ear: External ear normal.  Mouth/Throat: Oropharynx is clear and moist.  Eyes: Pupils are equal, round, and reactive to light.  Neck: Normal range of motion. Neck supple. No thyromegaly present.  Cardiovascular: Normal rate, regular rhythm, normal heart sounds and intact distal pulses.   No murmur heard. Pulmonary/Chest: Effort normal and breath sounds normal. No respiratory distress.  Abdominal: Soft. Bowel sounds are normal. She exhibits no distension. There is no tenderness.  Musculoskeletal: Normal range of motion. She exhibits no edema.  Lymphadenopathy:    She has no cervical adenopathy.  Neurological: She is alert and oriented to person, place, and time.  Skin: Skin is warm and dry.  Psychiatric: She has a normal mood and affect. Her behavior is normal.   BP 133/77   Pulse 74   Temp (!) 97.4 F (36.3 C) (Oral)   Ht '5\' 6"'  (1.676 m)   Wt 203 lb (92.1 kg)   BMI 32.77 kg/m      Assessment & Plan:  1. Essential hypertension Low sodium diet - CMP14+EGFR - amLODipine (NORVASC) 5 MG tablet; Take 1 tablet (5 mg total) by mouth daily.  Dispense: 90 tablet; Refill:  1  2. Hypothyroidism, unspecified type - Thyroid Panel With TSH - levothyroxine (SYNTHROID, LEVOTHROID) 50 MCG tablet; TAKE 1 TABLET ONCE A DAY BEFORE BREAKFAST  Dispense: 34 tablet; Refill: 1   3. Hyperlipidemia, unspecified hyperlipidemia type Low fat diet - Lipid panel  4. Recurrent major depressive disorder, in remission ([place]) Continue zoloft as rx  sertraline (ZOLOFT) 100 MG tablet; Take 1 tablet (100 mg total) by mouth daily.  Dispense: 90 tablet; Refill: 1   5.  Primary insomnia Bedtime routine - zolpidem (AMBIEN) 10 MG tablet; Take 1 tablet (10 mg total) by mouth at bedtime as needed for sleep.  Dispense: 30 tablet; Refill: 2   6. BMI 29.0-29.9,adult Discussed diet and exercise for person with BMI >25 Will recheck weight in 3-6 months       Labs pending Health maintenance reviewed Diet and exercise encouraged Continue all meds Follow up  In 6 months   [place], FNP

## 2017-08-18 LAB — CMP14+EGFR
A/G RATIO: 1.3 (ref 1.2–2.2)
ALT: 8 IU/L (ref 0–32)
AST: 14 IU/L (ref 0–40)
Albumin: 4.2 g/dL (ref 3.6–4.8)
Alkaline Phosphatase: 158 IU/L — ABNORMAL HIGH (ref 39–117)
BUN/Creatinine Ratio: 15 (ref 12–28)
BUN: 12 mg/dL (ref 8–27)
CHLORIDE: 104 mmol/L (ref 96–106)
CO2: 23 mmol/L (ref 20–29)
Calcium: 9.5 mg/dL (ref 8.7–10.3)
Creatinine, Ser: 0.78 mg/dL (ref 0.57–1.00)
GFR calc non Af Amer: 81 mL/min/{1.73_m2} (ref >59)
GFR, EST AFRICAN AMERICAN: 93 mL/min/{1.73_m2} (ref >59)
GLUCOSE: 101 mg/dL — AB (ref 65–99)
Globulin, Total: 3.3 g/dL (ref 1.5–4.5)
POTASSIUM: 4.5 mmol/L (ref 3.5–5.2)
Sodium: 142 mmol/L (ref 134–144)
TOTAL PROTEIN: 7.5 g/dL (ref 6.0–8.5)

## 2017-08-18 LAB — LIPID PANEL
CHOLESTEROL TOTAL: 212 mg/dL — AB (ref 100–199)
Chol/HDL Ratio: 3.4 ratio (ref 0.0–4.4)
HDL: 63 mg/dL (ref >39)
LDL Calculated: 129 mg/dL — ABNORMAL HIGH (ref 0–99)
Triglycerides: 100 mg/dL (ref 0–149)
VLDL CHOLESTEROL CAL: 20 mg/dL (ref 5–40)

## 2017-08-18 LAB — THYROID PANEL WITH TSH
Free Thyroxine Index: 2.1 (ref 1.2–4.9)
T3 Uptake Ratio: 27 % (ref 24–39)
T4, Total: 7.7 ug/dL (ref 4.5–12.0)
TSH: 1.12 u[IU]/mL (ref 0.450–4.500)

## 2017-11-29 ENCOUNTER — Other Ambulatory Visit: Payer: Self-pay | Admitting: Nurse Practitioner

## 2017-11-29 DIAGNOSIS — E039 Hypothyroidism, unspecified: Secondary | ICD-10-CM

## 2018-03-30 ENCOUNTER — Other Ambulatory Visit: Payer: Self-pay | Admitting: Nurse Practitioner

## 2018-03-30 DIAGNOSIS — I10 Essential (primary) hypertension: Secondary | ICD-10-CM

## 2018-03-31 NOTE — Telephone Encounter (Signed)
Last refill without being seen 

## 2018-05-02 ENCOUNTER — Encounter: Payer: Self-pay | Admitting: Physician Assistant

## 2018-05-02 ENCOUNTER — Ambulatory Visit (INDEPENDENT_AMBULATORY_CARE_PROVIDER_SITE_OTHER): Payer: No Typology Code available for payment source | Admitting: Physician Assistant

## 2018-05-02 VITALS — BP 121/68 | HR 78 | Temp 96.4°F | Ht 66.0 in | Wt 201.8 lb

## 2018-05-02 DIAGNOSIS — F5101 Primary insomnia: Secondary | ICD-10-CM

## 2018-05-02 DIAGNOSIS — R109 Unspecified abdominal pain: Secondary | ICD-10-CM

## 2018-05-02 LAB — URINALYSIS, COMPLETE
Bilirubin, UA: NEGATIVE
GLUCOSE, UA: NEGATIVE
Leukocytes, UA: NEGATIVE
NITRITE UA: NEGATIVE
Protein, UA: NEGATIVE
Specific Gravity, UA: 1.025 (ref 1.005–1.030)
UUROB: 0.2 mg/dL (ref 0.2–1.0)
pH, UA: 5.5 (ref 5.0–7.5)

## 2018-05-02 LAB — MICROSCOPIC EXAMINATION: Bacteria, UA: NONE SEEN

## 2018-05-02 MED ORDER — ZOLPIDEM TARTRATE 10 MG PO TABS: 10.0000 mg | ORAL_TABLET | Freq: Every evening | ORAL | 2 refills | Status: DC | PRN

## 2018-05-02 MED ORDER — HYDROCODONE-ACETAMINOPHEN 5-325 MG PO TABS: 1.0000 | ORAL_TABLET | ORAL | 0 refills | Status: DC | PRN

## 2018-05-02 MED ORDER — TAMSULOSIN HCL 0.4 MG PO CAPS: 0.4000 mg | ORAL_CAPSULE | Freq: Every day | ORAL | 3 refills | Status: DC

## 2018-05-02 NOTE — Progress Notes (Signed)
BP 121/68   Pulse 78   Temp (!) 96.4 F (35.8 C) (Oral)   Ht  (1.676 m)   Wt 201 lb 12.8 oz (91.5 kg)   BMI 32.57 kg/m    Subjective:    Patient ID: Marissa Wells, female    DOB: 1951/12/06, 67 y.o.   MRN: [id number]  HPI: Marissa Wells is a 67 y.o. female presenting on 05/02/2018 for Flank Pain (Left side. x 1 month - worse x 5 days- constant )  Patient comes.  She was treated for urinary tract infection.  It got better.  However the last 5 days it is constant worse today it is quite painful.  She has never had a history of kidney stone in the past.  She states the pain is quite severe at times.  Almost as severe as labor pains.  She has not had any fever or chills.  Past Medical History:  Diagnosis Date  . Hyperlipidemia   . Hypertension   . Thyroid disease    Relevant past medical, surgical, family and social history reviewed and updated as indicated. Interim medical history since our last visit reviewed. Allergies and medications reviewed and updated. DATA REVIEWED: CHART IN EPIC  Family History reviewed for pertinent findings.  Review of Systems  Constitutional: Negative.   HENT: Negative.   Eyes: Negative.   Respiratory: Negative.   Gastrointestinal: Negative.   Genitourinary: Positive for flank pain. Negative for difficulty urinating, dysuria, enuresis, frequency, genital sores, hematuria, menstrual problem, pelvic pain and urgency.    Allergies as of 05/02/2018      Reactions   Compazine [prochlorperazine Edisylate]       Medication List        Accurate as of 05/02/18  8:52 PM. Always use your most recent med list.          amLODipine 5 MG tablet Commonly known as:  NORVASC TAKE 1 TABLET DAILY   aspirin 81 MG tablet Take 81 mg by mouth daily.   Calcium-Magnesium-Vitamin D 600-40-500 MG-MG-UNIT Tb24 Take by mouth.   HYDROcodone-acetaminophen 5-325 MG tablet Commonly known as:  NORCO/VICODIN Take 1 tablet by mouth every 4 (four) hours  as needed for moderate pain.   levothyroxine 50 MCG tablet Commonly known as:  SYNTHROID, LEVOTHROID TAKE 1 TABLET ONCE A DAY BEFORE BREAKFAST   sertraline 100 MG tablet Commonly known as:  ZOLOFT Take 1 tablet (100 mg total) by mouth daily.   tamsulosin 0.4 MG Caps capsule Commonly known as:  FLOMAX Take 1 capsule (0.4 mg total) by mouth daily.   zolpidem 10 MG tablet Commonly known as:  AMBIEN Take 1 tablet (10 mg total) by mouth at bedtime as needed for sleep.          Objective:    BP 121/68   Pulse 78   Temp (!) 96.4 F (35.8 C) (Oral)   Ht  (1.676 m)   Wt 201 lb 12.8 oz (91.5 kg)   BMI 32.57 kg/m   Allergies  Allergen Reactions  . Compazine [Prochlorperazine Edisylate]     Wt Readings from Last 3 Encounters:  05/02/18 201 lb 12.8 oz (91.5 kg)  08/17/17 203 lb (92.1 kg)  04/23/17 202 lb (91.6 kg)    Physical Exam  Constitutional: She is oriented to person, place, and time. She appears well-developed and well-nourished.  HENT:  Head: Normocephalic and atraumatic.  Eyes: Pupils are equal, round, and reactive to light. Conjunctivae and EOM are normal.  Cardiovascular: Normal rate, regular rhythm, normal heart sounds and intact distal pulses.  Pulmonary/Chest: Effort normal and breath sounds normal.  Abdominal: Soft. Bowel sounds are normal. There is no hepatosplenomegaly or hepatomegaly. There is tenderness. There is CVA tenderness.  Neurological: She is alert and oriented to person, place, and time. She has normal reflexes.  Skin: Skin is warm and dry. No rash noted.  Psychiatric: She has a normal mood and affect. Her behavior is normal. Judgment and thought content normal.    Results for orders placed or performed in visit on 05/02/18  Microscopic Examination  Result Value Ref Range   WBC, UA 0-5 0 - 5 /hpf   RBC, UA 0-2 0 - 2 /hpf   Epithelial Cells (non renal) 0-10 0 - 10 /hpf   Bacteria, UA None seen None seen/Few  Urinalysis, Complete    Result Value Ref Range   Specific Gravity, UA 1.025 1.005 - 1.030   pH, UA 5.5 5.0 - 7.5   Color, UA Yellow Yellow   Appearance Ur Clear Clear   Leukocytes, UA Negative Negative   Protein, UA Negative Negative/Trace   Glucose, UA Negative Negative   Ketones, UA Trace (A) Negative   RBC, UA Trace (A) Negative   Bilirubin, UA Negative Negative   Urobilinogen, Ur 0.2 0.2 - 1.0 mg/dL   Nitrite, UA Negative Negative   Microscopic Examination See below:       Assessment & Plan:   1. Flank pain - Urinalysis - Urine Culture - Urine Microscopic  2. Primary insomnia - zolpidem (AMBIEN) 10 MG tablet; Take 1 tablet (10 mg total) by mouth at bedtime as needed for sleep.  Dispense: 30 tablet; Refill: 2   Continue all other maintenance medications as listed above.  Follow up plan: No follow-ups on file.  Educational handout given for survey  Remus Loffler PA-C [facility] [address]  [place], [place] [address] [phone]   05/02/2018, 8:52 PM

## 2018-05-03 LAB — URINE CULTURE: ORGANISM ID, BACTERIA: NO GROWTH

## 2018-11-10 ENCOUNTER — Other Ambulatory Visit: Payer: Self-pay | Admitting: Nurse Practitioner

## 2018-11-10 DIAGNOSIS — I10 Essential (primary) hypertension: Secondary | ICD-10-CM

## 2019-01-05 ENCOUNTER — Other Ambulatory Visit: Payer: Self-pay | Admitting: Nurse Practitioner

## 2019-01-05 DIAGNOSIS — E039 Hypothyroidism, unspecified: Secondary | ICD-10-CM

## 2019-02-06 ENCOUNTER — Ambulatory Visit (INDEPENDENT_AMBULATORY_CARE_PROVIDER_SITE_OTHER): Payer: Medicare Other | Admitting: Nurse Practitioner

## 2019-02-06 ENCOUNTER — Encounter: Payer: Self-pay | Admitting: Nurse Practitioner

## 2019-02-06 VITALS — BP 136/80 | HR 68 | Temp 97.0°F | Ht 66.0 in | Wt 190.0 lb

## 2019-02-06 DIAGNOSIS — E785 Hyperlipidemia, unspecified: Secondary | ICD-10-CM

## 2019-02-06 DIAGNOSIS — E039 Hypothyroidism, unspecified: Secondary | ICD-10-CM

## 2019-02-06 DIAGNOSIS — F3340 Major depressive disorder, recurrent, in remission, unspecified: Secondary | ICD-10-CM

## 2019-02-06 DIAGNOSIS — I10 Essential (primary) hypertension: Secondary | ICD-10-CM

## 2019-02-06 DIAGNOSIS — Z6830 Body mass index (BMI) 30.0-30.9, adult: Secondary | ICD-10-CM

## 2019-02-06 DIAGNOSIS — F5101 Primary insomnia: Secondary | ICD-10-CM | POA: Diagnosis not present

## 2019-02-06 DIAGNOSIS — Z1212 Encounter for screening for malignant neoplasm of rectum: Secondary | ICD-10-CM

## 2019-02-06 DIAGNOSIS — F3342 Major depressive disorder, recurrent, in full remission: Secondary | ICD-10-CM | POA: Diagnosis not present

## 2019-02-06 DIAGNOSIS — G4700 Insomnia, unspecified: Secondary | ICD-10-CM | POA: Diagnosis not present

## 2019-02-06 DIAGNOSIS — Z1211 Encounter for screening for malignant neoplasm of colon: Secondary | ICD-10-CM

## 2019-02-06 MED ORDER — ZOLPIDEM TARTRATE 10 MG PO TABS: 10.0000 mg | ORAL_TABLET | Freq: Every evening | ORAL | 5 refills | Status: DC | PRN

## 2019-02-06 MED ORDER — LEVOTHYROXINE SODIUM 50 MCG PO TABS: ORAL_TABLET | ORAL | 1 refills | Status: DC

## 2019-02-06 MED ORDER — AMLODIPINE BESYLATE 5 MG PO TABS: 5.0000 mg | ORAL_TABLET | Freq: Every day | ORAL | 1 refills | Status: DC

## 2019-02-06 MED ORDER — SERTRALINE HCL 100 MG PO TABS: 100.0000 mg | ORAL_TABLET | Freq: Every day | ORAL | 1 refills | Status: DC

## 2019-02-06 NOTE — Addendum Note (Signed)
Addended by: Bennie Pierini on: 02/06/2019 04:29 PM   Modules accepted: Orders

## 2019-02-06 NOTE — Progress Notes (Signed)
Subjective:    Patient ID: Marissa Wells, female    DOB: May 09, 1951, 68 y.o.   MRN: [id number]   Chief Complaint: Medical Management of Chronic Issues   HPI:  1. Essential hypertension  No c/o chest pain, sob or headache. Does not check blood pressure at home. BP Readings from Last 3 Encounters:  02/06/19 136/80  05/02/18 121/68  08/17/17 133/77     2. Hyperlipidemia, unspecified hyperlipidemia type  Does not watch diet and does very little exercise  3. Hypothyroidism, unspecified type  No problems that she is aware of.  4. Insomnia, unspecified type  Takes ambien to sleep  5. Recurrent major depressive disorder, in remission [facility]) She has been on zoloft for severla years.says she is doing well. Depression screen [facility] 2/9 02/06/2019 05/02/2018 08/17/2017  Decreased Interest 0 0 0  Down, Depressed, Hopeless 0 0 0  PHQ - 2 Score 0 0 0      6. BMI 29.0-29.9,adult  Weight up 5 lbs from last visit    Outpatient Encounter Medications as of 02/06/2019  Medication Sig  . amLODipine (NORVASC) 5 MG tablet Take 1 tablet (5 mg total) by mouth daily. (Needs to be seen before next refill)  . aspirin 81 MG tablet Take 81 mg by mouth daily.  . Calcium-Magnesium-Vitamin D [id number] MG-MG-UNIT TB24 Take by mouth.  . levothyroxine (SYNTHROID, LEVOTHROID) 50 MCG tablet TAKE 1 TABLET ONCE A DAY BEFORE BREAKFAST (Needs to be seen before next refill)  . sertraline (ZOLOFT) 100 MG tablet Take 1 tablet (100 mg total) by mouth daily.  Marland Kitchen zolpidem (AMBIEN) 10 MG tablet Take 1 tablet (10 mg total) by mouth at bedtime as needed for sleep.       New complaints: None today  Social history: Works as Ambulance person.   Review of Systems     Objective:   Physical Exam Vitals signs and nursing note reviewed.  Constitutional:      General: She is not in acute distress.    Appearance: Normal appearance. She is well-developed.  HENT:     Head: Normocephalic.     Nose: Nose normal.    Eyes:     Pupils: Pupils are equal, round, and reactive to light.  Neck:     Musculoskeletal: Normal range of motion and neck supple.     Vascular: No carotid bruit or JVD.  Cardiovascular:     Rate and Rhythm: Normal rate and regular rhythm.     Heart sounds: Normal heart sounds.  Pulmonary:     Effort: Pulmonary effort is normal. No respiratory distress.     Breath sounds: Normal breath sounds. No wheezing or rales.  Chest:     Chest wall: No tenderness.  Abdominal:     General: Bowel sounds are normal. There is no distension or abdominal bruit.     Palpations: Abdomen is soft. There is no hepatomegaly, splenomegaly, mass or pulsatile mass.     Tenderness: There is no abdominal tenderness.  Musculoskeletal: Normal range of motion.  Lymphadenopathy:     Cervical: No cervical adenopathy.  Skin:    General: Skin is warm and dry.  Neurological:     Mental Status: She is alert and oriented to person, place, and time.     Deep Tendon Reflexes: Reflexes are normal and symmetric.  Psychiatric:        Behavior: Behavior normal.        Thought Content: Thought content normal.  Judgment: Judgment normal.       BP 136/80   Pulse 68   Temp (!) 97 F (36.1 C) (Oral)   Ht '5\' 6"'  (1.676 m)   Wt 190 lb (86.2 kg)   BMI 30.67 kg/m      Assessment & Plan:  SHANNAN SLINKER comes in today with chief complaint of Medical Management of Chronic Issues   Diagnosis and orders addressed:  1. Essential hypertension Low sodium diet - amLODipine (NORVASC) 5 MG tablet; Take 1 tablet (5 mg total) by mouth daily. (Needs to be seen before next refill)  Dispense: 90 tablet; Refill: 1 - CMP14+EGFR  2. Hyperlipidemia, unspecified hyperlipidemia type Low fat diet - Lipid panel  3. Hypothyroidism, unspecified type - Thyroid Panel With TSH- levothyroxine (SYNTHROID, LEVOTHROID) 50 MCG tablet; TAKE 1 TABLET ONCE A DAY BEFORE BREAKFAST (Needs to be seen before next refill)  Dispense: 90 tablet;  Refill: 1  4. Primary insomnia - zolpidem (AMBIEN) 10 MG tablet; Take 1 tablet (10 mg total) by mouth at bedtime as needed for up to 30 days for sleep.  Dispense: 30 tablet; Refill: 5 Bedtime routine  5. Recurrent major depressive disorder, in remission ([place]) Continue zoloft as rx - sertraline (ZOLOFT) 100 MG tablet; Take 1 tablet (100 mg total) by mouth daily.  Dispense: 90 tablet; Refill: 1  6. BMI 30.0-30.9,adult Discussed diet and exercise for person with BMI >25 Will recheck weight in 3-6 months      Labs pending Health Maintenance reviewed Diet and exercise encouraged  Follow up plan: 6 months   Mary-Margaret Hassell Done, FNP

## 2019-02-06 NOTE — Patient Instructions (Signed)

## 2019-02-06 NOTE — Addendum Note (Signed)
Addended by: Bennie Pierini on: 02/06/2019 04:10 PM   Modules accepted: Orders

## 2019-02-07 LAB — CMP14+EGFR
A/G RATIO: 1.4 (ref 1.2–2.2)
ALBUMIN: 4.3 g/dL (ref 3.8–4.8)
ALT: 9 IU/L (ref 0–32)
AST: 12 IU/L (ref 0–40)
Alkaline Phosphatase: 130 IU/L — ABNORMAL HIGH (ref 39–117)
BILIRUBIN TOTAL: 0.2 mg/dL (ref 0.0–1.2)
BUN / CREAT RATIO: 16 (ref 12–28)
BUN: 14 mg/dL (ref 8–27)
CALCIUM: 9 mg/dL (ref 8.7–10.3)
CO2: 20 mmol/L (ref 20–29)
Chloride: 107 mmol/L — ABNORMAL HIGH (ref 96–106)
Creatinine, Ser: 0.85 mg/dL (ref 0.57–1.00)
GFR, EST AFRICAN AMERICAN: 83 mL/min/{1.73_m2} (ref >59)
GFR, EST NON AFRICAN AMERICAN: 72 mL/min/{1.73_m2} (ref >59)
Globulin, Total: 3 g/dL (ref 1.5–4.5)
Glucose: 81 mg/dL (ref 65–99)
Potassium: 4 mmol/L (ref 3.5–5.2)
Sodium: 142 mmol/L (ref 134–144)
TOTAL PROTEIN: 7.3 g/dL (ref 6.0–8.5)

## 2019-02-07 LAB — LIPID PANEL
CHOLESTEROL TOTAL: 177 mg/dL (ref 100–199)
Chol/HDL Ratio: 3.1 ratio (ref 0.0–4.4)
HDL: 57 mg/dL (ref >39)
LDL CALC: 110 mg/dL — AB (ref 0–99)
TRIGLYCERIDES: 52 mg/dL (ref 0–149)
VLDL CHOLESTEROL CAL: 10 mg/dL (ref 5–40)

## 2019-02-07 LAB — THYROID PANEL WITH TSH
Free Thyroxine Index: 2 (ref 1.2–4.9)
T3 UPTAKE RATIO: 24 % (ref 24–39)
T4, Total: 8.4 ug/dL (ref 4.5–12.0)
TSH: 2.79 u[IU]/mL (ref 0.450–4.500)

## 2019-02-15 DIAGNOSIS — Z1211 Encounter for screening for malignant neoplasm of colon: Secondary | ICD-10-CM | POA: Diagnosis not present

## 2019-02-15 DIAGNOSIS — Z1212 Encounter for screening for malignant neoplasm of rectum: Secondary | ICD-10-CM | POA: Diagnosis not present

## 2019-02-19 LAB — COLOGUARD: COLOGUARD: NEGATIVE

## 2019-03-03 DIAGNOSIS — N6310 Unspecified lump in the right breast, unspecified quadrant: Secondary | ICD-10-CM | POA: Diagnosis not present

## 2019-03-03 DIAGNOSIS — R87619 Unspecified abnormal cytological findings in specimens from cervix uteri: Secondary | ICD-10-CM | POA: Insufficient documentation

## 2019-03-06 ENCOUNTER — Other Ambulatory Visit: Payer: Self-pay | Admitting: Obstetrics and Gynecology

## 2019-03-06 DIAGNOSIS — N644 Mastodynia: Secondary | ICD-10-CM

## 2019-03-09 ENCOUNTER — Other Ambulatory Visit: Payer: No Typology Code available for payment source

## 2019-03-09 ENCOUNTER — Other Ambulatory Visit: Payer: Self-pay | Admitting: Obstetrics and Gynecology

## 2019-03-09 DIAGNOSIS — N63 Unspecified lump in unspecified breast: Secondary | ICD-10-CM

## 2019-03-13 ENCOUNTER — Other Ambulatory Visit: Payer: Self-pay

## 2019-03-13 ENCOUNTER — Ambulatory Visit
Admission: RE | Admit: 2019-03-13 | Discharge: 2019-03-13 | Disposition: A | Discharge status: Home / Self Care | Payer: Medicare Other | Source: Ambulatory Visit | Attending: Obstetrics and Gynecology | Admitting: Obstetrics and Gynecology

## 2019-03-13 DIAGNOSIS — N63 Unspecified lump in unspecified breast: Secondary | ICD-10-CM

## 2019-03-13 DIAGNOSIS — R928 Other abnormal and inconclusive findings on diagnostic imaging of breast: Secondary | ICD-10-CM | POA: Diagnosis not present

## 2019-03-13 DIAGNOSIS — N6489 Other specified disorders of breast: Secondary | ICD-10-CM | POA: Diagnosis not present

## 2019-04-14 DIAGNOSIS — N952 Postmenopausal atrophic vaginitis: Secondary | ICD-10-CM | POA: Diagnosis not present

## 2019-04-14 DIAGNOSIS — Z01419 Encounter for gynecological examination (general) (routine) without abnormal findings: Secondary | ICD-10-CM | POA: Diagnosis not present

## 2019-04-14 DIAGNOSIS — Z1231 Encounter for screening mammogram for malignant neoplasm of breast: Secondary | ICD-10-CM | POA: Diagnosis not present

## 2019-04-14 DIAGNOSIS — Z6831 Body mass index (BMI) 31.0-31.9, adult: Secondary | ICD-10-CM | POA: Diagnosis not present

## 2019-08-30 IMAGING — MG DIGITAL DIAGNOSTIC UNILATERAL RIGHT MAMMOGRAM WITH TOMO AND CAD
6 series · 6 of 18 positions shown · non-contrast
Comparison: Previous exam(s).

CLINICAL DATA: Palpable lump in the right breast.

EXAM:
DIGITAL DIAGNOSTIC RIGHT MAMMOGRAM WITH CAD AND TOMO
ULTRASOUND RIGHT BREAST

[R CC synth-2D]
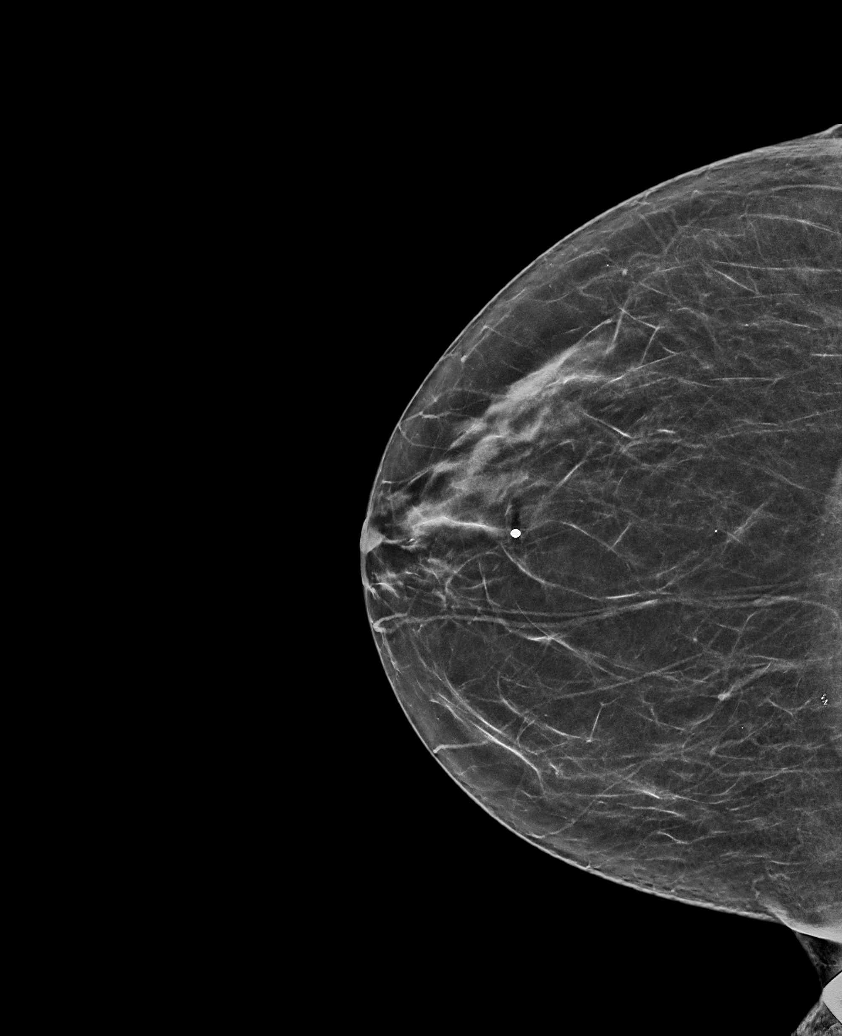

[R TAN synth-2D]
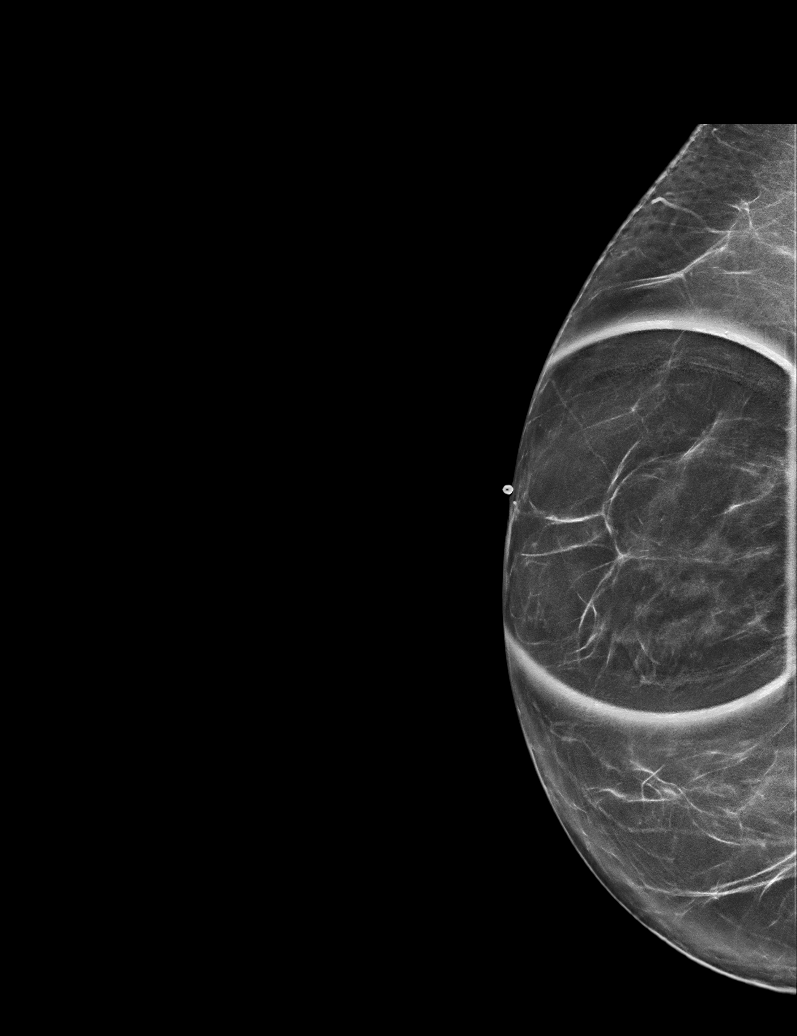

[R MLO synth-2D]
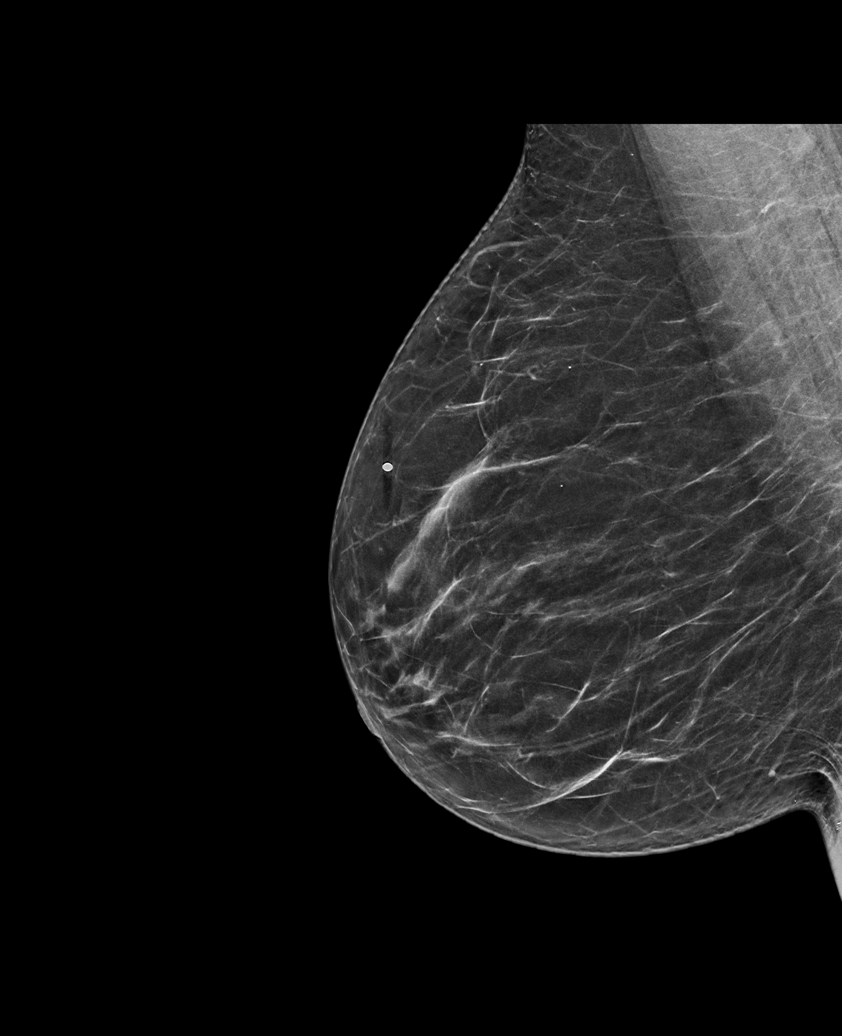

[R MLO tomo · tomo slice 35/69.0]
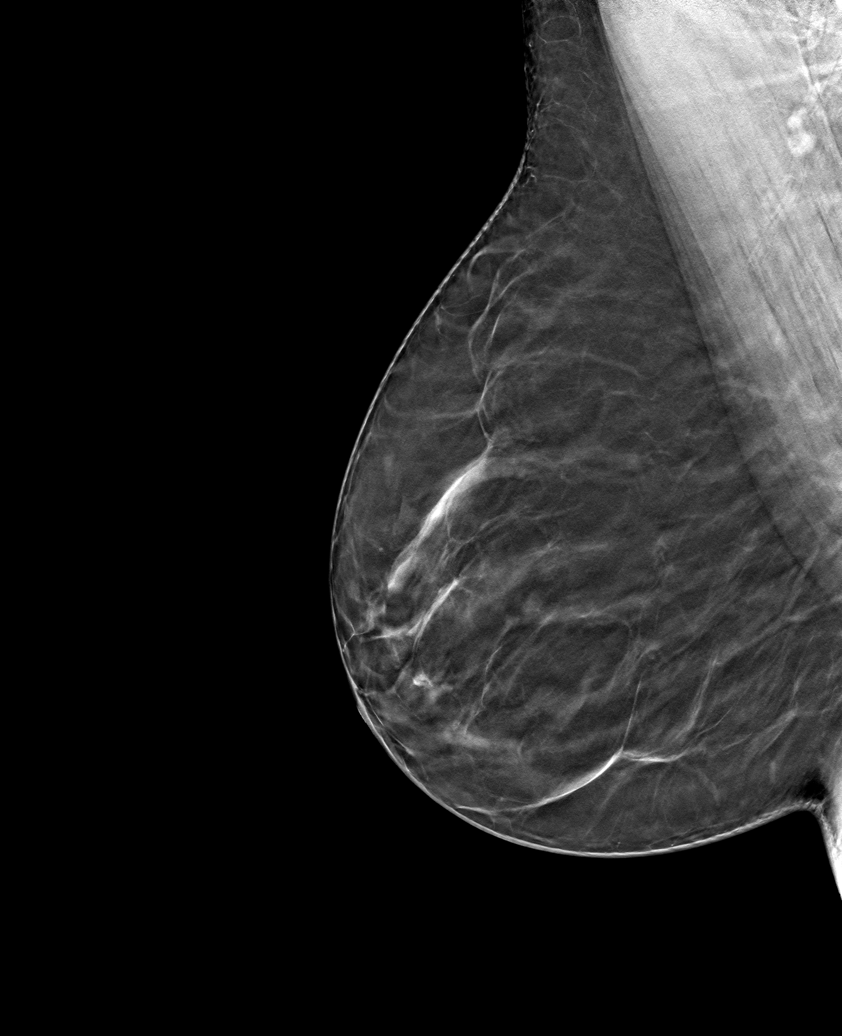

[R CC tomo · tomo slice 32/63.0]
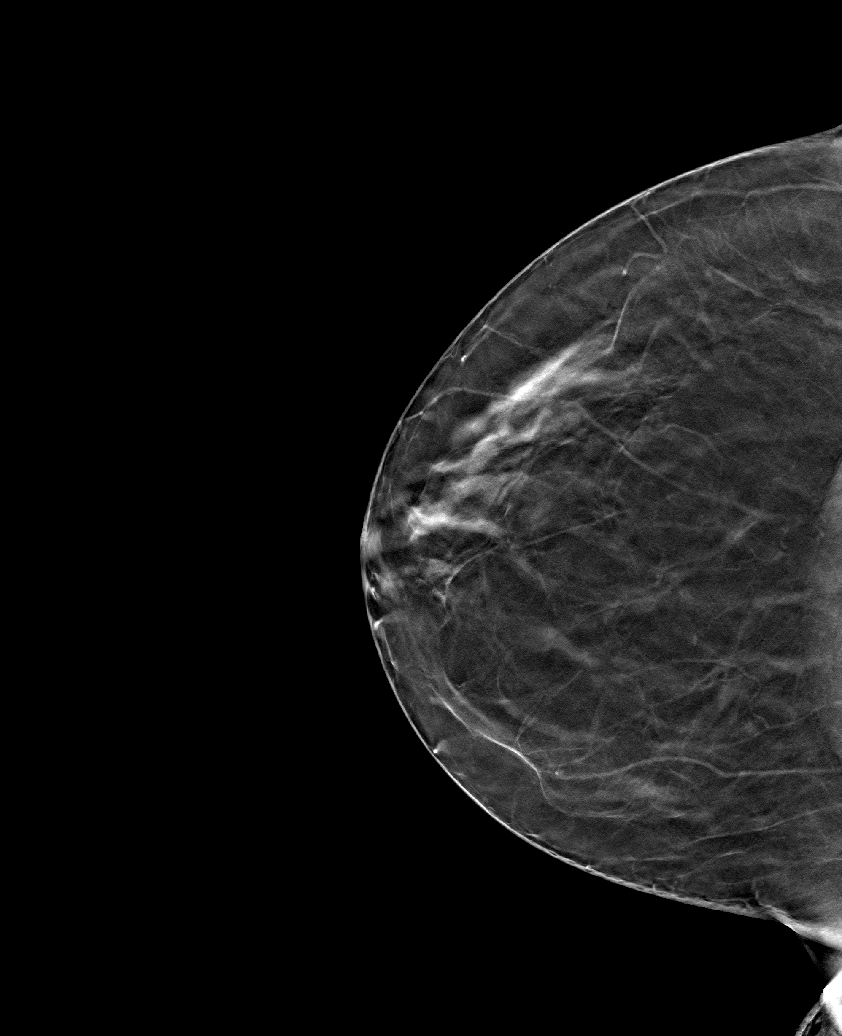

[R TAN tomo · tomo slice 31/61.0]
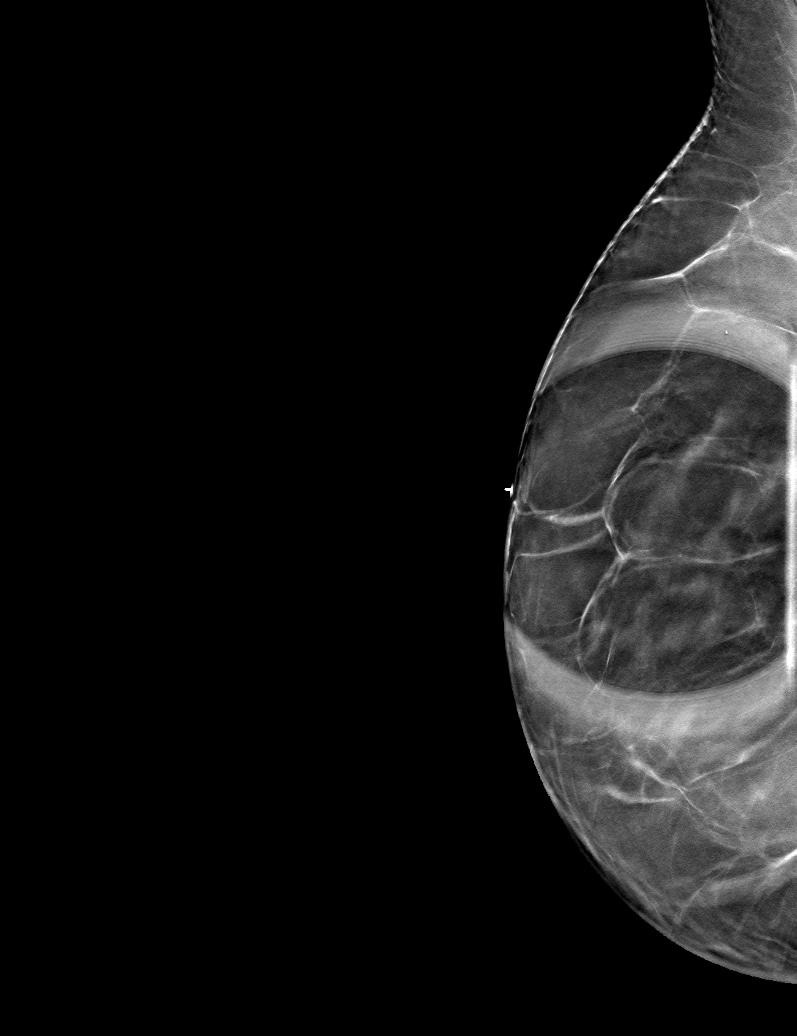

[6 of 18 positions shown; findings below may reference images not displayed]

ACR Breast Density Category b: There are scattered areas of
fibroglandular density.
FINDINGS: No suspicious masses, calcifications, or distortion in the right
breast.

Mammographic images were processed with CAD.

On physical exam, no suspicious lumps are identified.

Targeted ultrasound is performed, showing no suspicious findings in
the region of the right palpable lump.
IMPRESSION: No mammographic or sonographic evidence of malignancy. No cause for
the right palpable lump identified.

RECOMMENDATION:
Treatment of the patient's palpable lump should be based on clinical
and physical exam given lack of imaging findings. Recommend annual
screening mammography in March 2019.

I have discussed the findings and recommendations with the patient.
Results were also provided in writing at the conclusion of the
visit. If applicable, a reminder letter will be sent to the patient
regarding the next appointment.

BI-RADS CATEGORY  1: Negative.

## 2019-10-06 ENCOUNTER — Other Ambulatory Visit: Payer: Self-pay | Admitting: Nurse Practitioner

## 2019-10-06 DIAGNOSIS — I10 Essential (primary) hypertension: Secondary | ICD-10-CM

## 2019-10-06 DIAGNOSIS — E039 Hypothyroidism, unspecified: Secondary | ICD-10-CM

## 2019-10-06 DIAGNOSIS — F5101 Primary insomnia: Secondary | ICD-10-CM

## 2019-12-30 DIAGNOSIS — Z20828 Contact with and (suspected) exposure to other viral communicable diseases: Secondary | ICD-10-CM | POA: Diagnosis not present

## 2019-12-30 DIAGNOSIS — N3091 Cystitis, unspecified with hematuria: Secondary | ICD-10-CM | POA: Diagnosis not present

## 2019-12-30 DIAGNOSIS — B349 Viral infection, unspecified: Secondary | ICD-10-CM | POA: Diagnosis not present

## 2019-12-30 DIAGNOSIS — R509 Fever, unspecified: Secondary | ICD-10-CM | POA: Diagnosis not present

## 2020-01-31 ENCOUNTER — Ambulatory Visit (INDEPENDENT_AMBULATORY_CARE_PROVIDER_SITE_OTHER): Payer: Medicare Other | Admitting: Family Medicine

## 2020-01-31 ENCOUNTER — Encounter: Payer: Self-pay | Admitting: Family Medicine

## 2020-01-31 DIAGNOSIS — L309 Dermatitis, unspecified: Secondary | ICD-10-CM | POA: Diagnosis not present

## 2020-01-31 MED ORDER — NYSTATIN-TRIAMCINOLONE 100000-0.1 UNIT/GM-% EX CREA: 1.0000 "application " | TOPICAL_CREAM | Freq: Two times a day (BID) | CUTANEOUS | 0 refills | Status: DC

## 2020-01-31 NOTE — Patient Instructions (Addendum)
[facility]: Drive-through at [facility] on Tuesdays and Thursdays from 10 AM - 2 PM. Look for the big white tent. [address] [place], [place] [address]. Patient must bring consent form already filled out, which is online at [email or web address] [phone]  [facility]:  . Muse offering at the [facility] 8 AM - 1 PM, then will move to the [employer] on 01/24/20. To schedule visit [email or web address] . Public Health offering at the following: (Schedule appt: call [phone] and select option 2) o [facility]: [address] [place], [place] [address] o [facility] at [facility]: [address] [place], [place] [address] o [facility]: [address] [place], [place] [address]  [place]: Neurosurgeon in [place]. This is a first-come, first-serve drive thru clinic OR call their COVID hotline at [phone]. They soon will offer testing and vaccination at [facility].  [facility]: Call [phone]  [facility]: [facility] Department [phone], option 7.   Vaginitis  Vaginitis is irritation and swelling (inflammation) of the vagina. It happens when normal bacteria and yeast in the vagina grow too much. There are many types of this condition. Treatment will depend on the type you have. Follow these instructions at home: Lifestyle  Keep your vagina area clean and dry. ? Avoid using soap. ? Rinse the area with water.  Do not do the following until your doctor says it is okay: ? Wash and clean out the vagina (douche). ? Use tampons. ? Have sex.  Wipe from front to back after going to the bathroom.  Let air reach your vagina. ? Wear cotton underwear. ? Do not wear:  Underwear while you sleep.  Tight pants.  Thong underwear.  Underwear or nylons without a  cotton panel. ? Take off any wet clothing, such as bathing suits, as soon as possible.  Use gentle, non-scented products. Do not use things that can irritate the vagina, such as fabric softeners. Avoid the following products if they are scented: ? Feminine sprays. ? Detergents. ? Tampons. ? Feminine hygiene products. ? Soaps or bubble baths.  Practice safe sex and use condoms. General instructions  Take over-the-counter and prescription medicines only as told by your doctor.  If you were prescribed an antibiotic medicine, take or use it as told by your doctor. Do not stop taking or using the antibiotic even if you start to feel better.  Keep all follow-up visits as told by your doctor. This is important. Contact a doctor if:  You have pain in your belly.  You have a fever.  Your symptoms last for more than 2-3 days. Get help right away if:  You have a fever and your symptoms get worse all of a sudden. Summary  Vaginitis is irritation and swelling of the vagina. It can happen when the normal bacteria and yeast in the vagina grow too much. There are many types.  Treatment will depend on the type you have.  Do not douche, use tampons , or have sex until your health care provider approves. When you can return to sex, practice safe sex and use condoms. This information is not intended to replace advice given to you by your health care provider. Make sure you discuss any questions you have with your health care provider. Document Revised: 11/26/2017 Document Reviewed: 01/05/2017 Elsevier Patient Education  2020 [employer].

## 2020-01-31 NOTE — Progress Notes (Signed)
Virtual Visit via Telephone Note  I connected with Marissa Wells on 01/31/20 at 11:00 AM by telephone and verified that I am speaking with the correct person using two identifiers. [name] is currently located at work and nobody is currently with her during this visit. The provider, Gwenlyn Fudge, FNP is located in their office at time of visit.  I discussed the limitations, risks, security and privacy concerns of performing an evaluation and management service by telephone and the availability of in person appointments. I also discussed with the patient that there may be a patient responsible charge related to this service. The patient expressed understanding and agreed to proceed.  Subjective: PCP: Bennie Pierini, FNP  Chief Complaint  Patient presents with  . Vaginitis   Vaginitis: Patient complains of a rash on the outside of her vagina that she describes as red and itchy with small bumps.  She reports this started a little more than 2 weeks ago when she started using a new vaginal deodorant spray.  She has quit using the spray as of 2 weeks ago.  The rash has gotten slightly better with the use of black seed oil but patient reports every time she stops using it the rash gets worse again.   ROS: Per HPI  Current Outpatient Medications:  .  amLODipine (NORVASC) 5 MG tablet, Take 1 tablet (5 mg total) by mouth daily. (Needs to be seen before next refill), Disp: 30 tablet, Rfl: 0 .  aspirin 81 MG tablet, Take 81 mg by mouth daily., Disp: , Rfl:  .  Calcium-Magnesium-Vitamin D [id number] MG-MG-UNIT TB24, Take by mouth., Disp: , Rfl:  .  levothyroxine (SYNTHROID) 50 MCG tablet, TAKE 1 TABLET DAILY, Disp: 90 tablet, Rfl: 0 .  sertraline (ZOLOFT) 100 MG tablet, Take 1 tablet (100 mg total) by mouth daily., Disp: 90 tablet, Rfl: 1 .  zolpidem (AMBIEN) 10 MG tablet, Take 1 tablet (10 mg total) by mouth at bedtime as needed for up to 30 days for sleep., Disp: 30 tablet, Rfl:  5  Allergies  Allergen Reactions  . Compazine [Prochlorperazine Edisylate]    Past Medical History:  Diagnosis Date  . Hyperlipidemia   . Hypertension   . Thyroid disease     Observations/Objective: A&O  No respiratory distress or wheezing audible over the phone Mood, judgement, and thought processes all WNL  Assessment and Plan: 1. Vulvar dermatitis - Education provided on vaginitis.  - nystatin-triamcinolone (MYCOLOG II) cream; Apply 1 application topically 2 (two) times daily.  Dispense: 30 g; Refill: 0   Follow Up Instructions:  I discussed the assessment and treatment plan with the patient. The patient was provided an opportunity to ask questions and all were answered. The patient agreed with the plan and demonstrated an understanding of the instructions.   The patient was advised to call back or seek an in-person evaluation if the symptoms worsen or if the condition fails to improve as anticipated.  The above assessment and management plan was discussed with the patient. The patient verbalized understanding of and has agreed to the management plan. Patient is aware to call the clinic if symptoms persist or worsen. Patient is aware when to return to the clinic for a follow-up visit. Patient educated on when it is appropriate to go to the emergency department.   Time call ended: 11:08 AM  I provided 11 minutes of non-face-to-face time during this encounter.  Deliah Boston, MSN, APRN, FNP-C [facility]  01/31/20   

## 2020-02-22 DIAGNOSIS — Z23 Encounter for immunization: Secondary | ICD-10-CM | POA: Diagnosis not present

## 2020-02-27 ENCOUNTER — Encounter: Payer: Self-pay | Admitting: Nurse Practitioner

## 2020-02-27 ENCOUNTER — Ambulatory Visit (INDEPENDENT_AMBULATORY_CARE_PROVIDER_SITE_OTHER): Payer: Medicare Other | Admitting: Nurse Practitioner

## 2020-02-27 DIAGNOSIS — Z6829 Body mass index (BMI) 29.0-29.9, adult: Secondary | ICD-10-CM | POA: Diagnosis not present

## 2020-02-27 DIAGNOSIS — F3342 Major depressive disorder, recurrent, in full remission: Secondary | ICD-10-CM | POA: Diagnosis not present

## 2020-02-27 DIAGNOSIS — G4700 Insomnia, unspecified: Secondary | ICD-10-CM | POA: Diagnosis not present

## 2020-02-27 DIAGNOSIS — F5101 Primary insomnia: Secondary | ICD-10-CM | POA: Diagnosis not present

## 2020-02-27 DIAGNOSIS — E039 Hypothyroidism, unspecified: Secondary | ICD-10-CM

## 2020-02-27 DIAGNOSIS — I10 Essential (primary) hypertension: Secondary | ICD-10-CM

## 2020-02-27 DIAGNOSIS — E785 Hyperlipidemia, unspecified: Secondary | ICD-10-CM

## 2020-02-27 MED ORDER — ZOLPIDEM TARTRATE 10 MG PO TABS: 10.0000 mg | ORAL_TABLET | Freq: Every evening | ORAL | 5 refills | Status: DC | PRN

## 2020-02-27 MED ORDER — AMLODIPINE BESYLATE 5 MG PO TABS: 5.0000 mg | ORAL_TABLET | Freq: Every day | ORAL | 1 refills | Status: DC

## 2020-02-27 MED ORDER — SERTRALINE HCL 100 MG PO TABS: 100.0000 mg | ORAL_TABLET | Freq: Every day | ORAL | 1 refills | Status: DC

## 2020-02-27 MED ORDER — LEVOTHYROXINE SODIUM 50 MCG PO TABS: ORAL_TABLET | ORAL | 1 refills | Status: DC

## 2020-02-27 NOTE — Progress Notes (Signed)
Virtual Visit via telephone Note Due to COVID-19 pandemic this visit was conducted virtually. This visit type was conducted due to national recommendations for restrictions regarding the COVID-19 Pandemic (e.g. social distancing, sheltering in place) in an effort to limit this patient's exposure and mitigate transmission in our community. All issues noted in this document were discussed and addressed.  A physical exam was not performed with this format.  I connected with Marissa Wells on 02/27/20 at 9:15 by telephone and verified that I am speaking with the correct person using two identifiers. Marissa Wells is currently located at work and no one is currently with  her during visit. The provider, Mary-Margaret Hassell Done, FNP is located in their office at time of visit.  I discussed the limitations, risks, security and privacy concerns of performing an evaluation and management service by telephone and the availability of in person appointments. I also discussed with the patient that there may be a patient responsible charge related to this service. The patient expressed understanding and agreed to proceed.   History and Present Illness:   Chief Complaint: No chief complaint on file.    HPI:  1. Essential hypertension No c/o chest pain, sob or headahce. Doe snot check blood pressure at home. BP Readings from Last 3 Encounters:  02/06/19 136/80  05/02/18 121/68  08/17/17 133/77      2. Hypothyroidism, unspecified type No problems that aware of  3. Hyperlipidemia, unspecified hyperlipidemia type Does watch diet but does very little exercise. Lab Results  Component Value Date   CHOL 177 02/06/2019   HDL 57 02/06/2019   LDLCALC 110 (H) 02/06/2019   TRIG 52 02/06/2019   CHOLHDL 3.1 02/06/2019     4. Recurrent major depressive disorder, in remission [facility]) Denies any depression if currently on zoloft and is doing well. Depression screen [facility] 2/9 02/27/2020 02/06/2019 05/02/2018  Decreased  Interest 0 0 0  Down, Depressed, Hopeless 0 0 0  PHQ - 2 Score 0 0 0     5. Insomnia, unspecified type Sleep well most nights. Uses ambien when she has trouble falling asleep.  6. BMI 29.0-29.9,adult Weight up 10 lbs since last visit- weight 199 Wt Readings from Last 3 Encounters:  02/06/19 190 lb (86.2 kg)  05/02/18 201 lb 12.8 oz (91.5 kg)  08/17/17 203 lb (92.1 kg)   BMI Readings from Last 3 Encounters:  02/06/19 30.67 kg/m  05/02/18 32.57 kg/m  08/17/17 32.77 kg/m       Outpatient Encounter Medications as of 02/27/2020  Medication Sig  . amLODipine (NORVASC) 5 MG tablet Take 1 tablet (5 mg total) by mouth daily. (Needs to be seen before next refill)  . aspirin 81 MG tablet Take 81 mg by mouth daily.  . Calcium-Magnesium-Vitamin D [id number] MG-MG-UNIT TB24 Take by mouth.  . levothyroxine (SYNTHROID) 50 MCG tablet TAKE 1 TABLET DAILY  . nystatin-triamcinolone (MYCOLOG II) cream Apply 1 application topically 2 (two) times daily.  . sertraline (ZOLOFT) 100 MG tablet Take 1 tablet (100 mg total) by mouth daily.  Marland Kitchen zolpidem (AMBIEN) 10 MG tablet Take 1 tablet (10 mg total) by mouth at bedtime as needed for up to 30 days for sleep.   No facility-administered encounter medications on file as of 02/27/2020.    Past Surgical History:  Procedure Laterality Date  . CHOLECYSTECTOMY    . TUBAL LIGATION      Family History  Problem Relation Age of Onset  . 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  Eyes: Negative for blurred vision, double vision and pain.  Respiratory: Negative for shortness of breath.   Cardiovascular: Negative for chest pain, palpitations, orthopnea and leg swelling.  Gastrointestinal: Negative for  abdominal pain.  Skin: Negative for rash.  Neurological: Negative for dizziness, sensory change, loss of consciousness, weakness and headaches.  Endo/Heme/Allergies: Negative for polydipsia. Does not bruise/bleed easily.  Psychiatric/Behavioral: Negative for memory loss. The patient does not have insomnia.   All other systems reviewed and are negative.    Observations/Objective: Alert and oriented- answers all questions appropriately No distress    Assessment and Plan: Marissa Wells comes in today with chief complaint of No chief complaint on file.   Diagnosis and orders addressed:  1. Essential hypertension Low sodium diet - CBC with Differential/Platelet; Future - CMP14+EGFR; Future - amLODipine (NORVASC) 5 MG tablet; Take 1 tablet (5 mg total) by mouth daily. (Needs to be seen before next refill)  Dispense: 90 tablet; Refill: 1  2. Acquired hypothyroidism - levothyroxine (SYNTHROID) 50 MCG tablet; TAKE 1 TABLET DAILY  Dispense: 90 tablet; Refill: 1 - Thyroid Panel With TSH; Future  3. Hyperlipidemia, unspecified hyperlipidemia type Low fat diet - Lipid panel; Future  4. Recurrent major depressive disorder, in full remission ([place]) Stress management - sertraline (ZOLOFT) 100 MG tablet; Take 1 tablet (100 mg total) by mouth daily.  Dispense: 90 tablet; Refill: 1  5.Primary insomnia Bedtime routine - zolpidem (AMBIEN) 10 MG tablet; Take 1 tablet (10 mg total) by mouth at bedtime as needed for sleep.  Dispense: 30 tablet; Refill: 5  6. BMI 29.0-29.9,adult Discussed diet and exercise for person with BMI >25 Will recheck weight in 3-6 months    Labs pending Health Maintenance reviewed Diet and exercise encouraged  Follow up plan: 6 months     I discussed the assessment and treatment plan with the patient. The patient was provided an opportunity to ask questions and all were answered. The patient agreed with the plan and demonstrated an understanding of the  instructions.   The patient was advised to call back or seek an in-person evaluation if the symptoms worsen or if the condition fails to improve as anticipated.  The above assessment and management plan was discussed with the patient. The patient verbalized understanding of and has agreed to the management plan. Patient is aware to call the clinic if symptoms persist or worsen. Patient is aware when to return to the clinic for a follow-up visit. Patient educated on when it is appropriate to go to the emergency department.   Time call ended:  9:35  I provided 20 minutes of non-face-to-face time during this encounter.    Mary-Margaret Hassell Done, FNP

## 2020-02-29 ENCOUNTER — Other Ambulatory Visit: Payer: Self-pay

## 2020-02-29 ENCOUNTER — Other Ambulatory Visit: Payer: Medicare Other

## 2020-02-29 DIAGNOSIS — E039 Hypothyroidism, unspecified: Secondary | ICD-10-CM | POA: Diagnosis not present

## 2020-02-29 DIAGNOSIS — E785 Hyperlipidemia, unspecified: Secondary | ICD-10-CM | POA: Diagnosis not present

## 2020-02-29 DIAGNOSIS — I10 Essential (primary) hypertension: Secondary | ICD-10-CM

## 2020-02-29 DIAGNOSIS — R739 Hyperglycemia, unspecified: Secondary | ICD-10-CM

## 2020-02-29 DIAGNOSIS — R7309 Other abnormal glucose: Secondary | ICD-10-CM | POA: Diagnosis not present

## 2020-03-01 LAB — CMP14+EGFR
ALT: 19 IU/L (ref 0–32)
AST: 22 IU/L (ref 0–40)
Albumin/Globulin Ratio: 1.2 (ref 1.2–2.2)
Albumin: 4.4 g/dL (ref 3.8–4.8)
Alkaline Phosphatase: 141 IU/L — ABNORMAL HIGH (ref 39–117)
BUN/Creatinine Ratio: 20 (ref 12–28)
BUN: 20 mg/dL (ref 8–27)
Bilirubin Total: 0.2 mg/dL (ref 0.0–1.2)
CO2: 18 mmol/L — ABNORMAL LOW (ref 20–29)
Calcium: 9.9 mg/dL (ref 8.7–10.3)
Chloride: 104 mmol/L (ref 96–106)
Creatinine, Ser: 0.98 mg/dL (ref 0.57–1.00)
GFR calc Af Amer: 70 mL/min/{1.73_m2} (ref >59)
GFR calc non Af Amer: 60 mL/min/{1.73_m2} (ref >59)
Globulin, Total: 3.6 g/dL (ref 1.5–4.5)
Glucose: 131 mg/dL — ABNORMAL HIGH (ref 65–99)
Potassium: 5 mmol/L (ref 3.5–5.2)
Sodium: 138 mmol/L (ref 134–144)
Total Protein: 8 g/dL (ref 6.0–8.5)

## 2020-03-01 LAB — CBC WITH DIFFERENTIAL/PLATELET
Basophils Absolute: 0.1 10*3/uL (ref 0.0–0.2)
Basos: 1 %
EOS (ABSOLUTE): 0.2 10*3/uL (ref 0.0–0.4)
Eos: 3 %
Hematocrit: 38.1 % (ref 34.0–46.6)
Hemoglobin: 12.5 g/dL (ref 11.1–15.9)
Immature Grans (Abs): 0 10*3/uL (ref 0.0–0.1)
Immature Granulocytes: 0 %
Lymphocytes Absolute: 2.4 10*3/uL (ref 0.7–3.1)
Lymphs: 37 %
MCH: 29.1 pg (ref 26.6–33.0)
MCHC: 32.8 g/dL (ref 31.5–35.7)
MCV: 89 fL (ref 79–97)
Monocytes Absolute: 0.5 10*3/uL (ref 0.1–0.9)
Monocytes: 8 %
Neutrophils Absolute: 3.2 10*3/uL (ref 1.4–7.0)
Neutrophils: 51 %
Platelets: 420 10*3/uL (ref 150–450)
RBC: 4.3 x10E6/uL (ref 3.77–5.28)
RDW: 14.2 % (ref 11.7–15.4)
WBC: 6.4 10*3/uL (ref 3.4–10.8)

## 2020-03-01 LAB — LIPID PANEL
Chol/HDL Ratio: 3.6 ratio (ref 0.0–4.4)
Cholesterol, Total: 180 mg/dL (ref 100–199)
HDL: 50 mg/dL (ref >39)
LDL Chol Calc (NIH): 111 mg/dL — ABNORMAL HIGH (ref 0–99)
Triglycerides: 107 mg/dL (ref 0–149)
VLDL Cholesterol Cal: 19 mg/dL (ref 5–40)

## 2020-03-01 LAB — THYROID PANEL WITH TSH
Free Thyroxine Index: 3.1 (ref 1.2–4.9)
T3 Uptake Ratio: 37 % (ref 24–39)
T4, Total: 8.5 ug/dL (ref 4.5–12.0)
TSH: 5.37 u[IU]/mL — ABNORMAL HIGH (ref 0.450–4.500)

## 2020-03-01 MED ORDER — LEVOTHYROXINE SODIUM 75 MCG PO TABS: 75.0000 ug | ORAL_TABLET | Freq: Every day | ORAL | 3 refills | Status: DC

## 2020-03-04 LAB — SPECIMEN STATUS REPORT

## 2020-03-04 LAB — HGB A1C W/O EAG: Hgb A1c MFr Bld: 6.3 % — ABNORMAL HIGH (ref 4.8–5.6)

## 2020-03-05 ENCOUNTER — Encounter: Payer: Self-pay | Admitting: Nurse Practitioner

## 2020-03-05 DIAGNOSIS — E1169 Type 2 diabetes mellitus with other specified complication: Secondary | ICD-10-CM | POA: Insufficient documentation

## 2020-03-05 DIAGNOSIS — E119 Type 2 diabetes mellitus without complications: Secondary | ICD-10-CM | POA: Insufficient documentation

## 2020-03-05 DIAGNOSIS — Z7985 Long-term (current) use of injectable non-insulin antidiabetic drugs: Secondary | ICD-10-CM | POA: Insufficient documentation

## 2020-03-19 ENCOUNTER — Ambulatory Visit (INDEPENDENT_AMBULATORY_CARE_PROVIDER_SITE_OTHER): Payer: Medicare Other | Admitting: *Deleted

## 2020-03-19 DIAGNOSIS — Z0000 Encounter for general adult medical examination without abnormal findings: Secondary | ICD-10-CM | POA: Diagnosis not present

## 2020-03-19 NOTE — Progress Notes (Signed)
MEDICARE ANNUAL WELLNESS VISIT    Telephone Visit Disclaimer This Medicare AWV was conducted by telephone due to national recommendations for restrictions regarding the COVID-19 Pandemic (e.g. social distancing).  I verified, using two identifiers, that I am speaking with Marissa Wells or their authorized healthcare agent. I discussed the limitations, risks, security, and privacy concerns of performing an evaluation and management service by telephone and the potential availability of an in-person appointment in the future. The patient expressed understanding and agreed to proceed.   Subjective:  Marissa Wells is a 69 y.o. female patient of Bennie Pierini, FNP who had a Medicare Annual Wellness Visit today via telephone. Hazely currently works as a Scientist, research (life sciences). She states retirement did not feel good. She lives with her husband Karren Burly. She has a daughter and son who are located in [place]. She has a Emergency planning/management officer and she enjoys gardening.  She reports that she is socially active and does interact with friends/family regularly. She is minimally physically active, walking 5 days per week.  Patient Care Team: Bennie Pierini, FNP as PCP - General (Nurse Practitioner)  Advanced Directives 03/19/2020  Does Patient Have a Medical Advance Directive? Yes  Type of Advance Directive Healthcare Power of [facility] Utilization Over the Past 12 Months: # of hospitalizations or ER visits: 1 # of surgeries: 0  Review of Systems    Patient reports that her overall health is unchanged compared to last year.  History obtained from the patient.  Patient Reported Readings (BP, Pulse, CBG, Weight, etc) none  Pain Assessment Pain : No/denies pain     Current Medications & Allergies (verified) Allergies as of 03/19/2020      Reactions   Compazine [prochlorperazine Edisylate]       Medication List       Accurate as of March 19, 2020  2:50 PM. If you have any  questions, ask your nurse or doctor.        amLODipine 5 MG tablet Commonly known as: NORVASC Take 1 tablet (5 mg total) by mouth daily. (Needs to be seen before next refill)   aspirin 81 MG tablet Take 81 mg by mouth daily.   Calcium-Magnesium-Vitamin D 600-40-500 MG-MG-UNIT Tb24 Take by mouth.   levothyroxine 75 MCG tablet Commonly known as: SYNTHROID Take 1 tablet (75 mcg total) by mouth daily.   nystatin-triamcinolone cream Commonly known as: MYCOLOG II Apply 1 application topically 2 (two) times daily.   sertraline 100 MG tablet Commonly known as: ZOLOFT Take 1 tablet (100 mg total) by mouth daily.   zolpidem 10 MG tablet Commonly known as: AMBIEN Take 1 tablet (10 mg total) by mouth at bedtime as needed for sleep.       History (reviewed): Past Medical History:  Diagnosis Date  . Hyperlipidemia   . Hypertension   . Thyroid disease    Past Surgical History:  Procedure Laterality Date  . CHOLECYSTECTOMY    . TUBAL LIGATION     Family History  Problem Relation Age of Onset  . Breast cancer Sister   . Hypertension Sister   . Diabetes Father   . Hyperlipidemia Father   . Hypertension Daughter   . Diabetes Son   . Hypertension Son   . Hypertension Sister    Social History   Socioeconomic History  . Marital status: Married    Spouse name: Karren Burly  . Number of children: 2  . Years of education: 63  .  Highest education level: Bachelor's degree (e.g., BA, AB, BS)  Occupational History  . Occupation: diability specialist  Tobacco Use  . Smoking status: Former Games developer  . Smokeless tobacco: Never Used  Substance and Sexual Activity  . Alcohol use: No  . Drug use: No  . Sexual activity: Not on file  Other Topics Concern  . Not on file  Social History Narrative  . Not on file   Social Determinants of Health   Financial Resource Strain:   . Difficulty of Paying Living Expenses:   Food Insecurity:   . Worried About Programme researcher, broadcasting/film/video in the Last  Year:   . Barista in the Last Year:   Transportation Needs:   . Freight forwarder (Medical):   Marland Kitchen Lack of Transportation (Non-Medical):   Physical Activity:   . Days of Exercise per Week:   . Minutes of Exercise per Session:   Stress:   . Feeling of Stress :   Social Connections:   . Frequency of Communication with Friends and Family:   . Frequency of Social Gatherings with Friends and Family:   . Attends Religious Services:   . Active Member of Clubs or Organizations:   . Attends Banker Meetings:   Marland Kitchen Marital Status:     Activities of Daily Living In your present state of health, do you have any difficulty performing the following activities: 03/19/2020  Hearing? N  Vision? N  Difficulty concentrating or making decisions? N  Walking or climbing stairs? N  Dressing or bathing? N  Doing errands, shopping? N  Preparing Food and eating ? N  Using the Toilet? N  In the past six months, have you accidently leaked urine? N  Do you have problems with loss of bowel control? N  Managing your Medications? N  Managing your Finances? N  Housekeeping or managing your Housekeeping? N  Some recent data might be hidden    Patient Education/ Literacy How often do you need to have someone help you when you read instructions, pamphlets, or other written materials from your doctor or pharmacy?: 1 - Never What is the last grade level you completed in school?: four years of college  Exercise Current Exercise Habits: Home exercise routine, Type of exercise: walking, Time (Minutes): 25, Frequency (Times/Week): 5, Weekly Exercise (Minutes/Week): 125, Intensity: Mild, Exercise limited by: None identified  Diet Patient reports consuming 1 meals a day and 2 snack(s) a day Patient reports that her primary diet is: Regular Patient reports that she does have regular access to food.   Depression Screen PHQ 2/9 Scores 03/19/2020 02/27/2020 02/06/2019 05/02/2018 08/17/2017 04/23/2017  05/11/2016  PHQ - 2 Score 1 0 0 0 0 0 0  PHQ- 9 Score 3 - - - - - -     Fall Risk Fall Risk  03/19/2020 02/27/2020 02/06/2019 05/02/2018 08/17/2017  Falls in the past year? 0 0 0 No No     Objective:  FARIN BUHMAN seemed alert and oriented and she participated appropriately during our telephone visit.  Blood Pressure Weight BMI  BP Readings from Last 3 Encounters:  02/06/19 136/80  05/02/18 121/68  08/17/17 133/77   Wt Readings from Last 3 Encounters:  02/06/19 190 lb (86.2 kg)  05/02/18 201 lb 12.8 oz (91.5 kg)  08/17/17 203 lb (92.1 kg)   BMI Readings from Last 1 Encounters:  02/06/19 30.67 kg/m    *Unable to obtain current vital signs, weight, and BMI due to  telephone visit type  Hearing/Vision  . Mikaelyn did not seem to have difficulty with hearing/understanding during the telephone conversation . Reports that she has had a formal eye exam by an eye care professional within the past year . Reports that she has not had a formal hearing evaluation within the past year *Unable to fully assess hearing and vision during telephone visit type  Cognitive Function: 6CIT Screen 03/19/2020  What Year? 0 points  What month? 0 points  What time? 0 points  Count back from 20 0 points  Months in reverse 0 points  Repeat phrase 0 points  Total Score 0   (Normal:0-7, Significant for Dysfunction: >8)  Normal Cognitive Function Screening: Yes   Immunization & Health Maintenance Record  There is no immunization history on file for this patient.  Health Maintenance  Topic Date Due  . FOOT EXAM  Never done  . URINE MICROALBUMIN  Never done  . INFLUENZA VACCINE  03/27/2020 (Originally 07/29/2019)  . TETANUS/TDAP  09/19/2020 (Originally 01/28/2014)  . PNA vac Low Risk Adult (1 of 2 - PCV13) 03/19/2021 (Originally 07/10/2018)  . MAMMOGRAM  03/29/2020  . OPHTHALMOLOGY EXAM  07/30/2020  . HEMOGLOBIN A1C  08/31/2020  . Fecal DNA (Cologuard)  02/15/2022  . DEXA SCAN  Completed  . Hepatitis C  Screening  Completed       Assessment  This is a routine wellness examination for [employer].  Health Maintenance: Due or Overdue Health Maintenance Due  Topic Date Due  . FOOT EXAM  Never done  . URINE MICROALBUMIN  Never done    [name] does not need a referral for Community Assistance: Care Management:   no Social Work:    no Prescription Assistance:  no Nutrition/Diabetes Education:  no   Plan:  Personalized Goals Goals Addressed            This Visit's Progress   . Have 3 meals a day        Personalized Health Maintenance & Screening Recommendations    Lung Cancer Screening Recommended: no (Low Dose CT Chest recommended if Age 65-80 years, 62 pack-yer currently smoking OR have quit w/in past 15 years) Hepatitis C Screening recommended: no HIV Screening recommended: no  Advanced Directives: Written information was not prepared per patient's request.  Referrals & Orders No orders of the defined types were placed in this encounter.   Follow-up Plan . Follow-up with Chevis Pretty, FNP as planned   I have personally reviewed and noted the following in the patient's chart:   . Medical and social history . Use of alcohol, tobacco or illicit drugs  . Current medications and supplements . Functional ability and status . Nutritional status . Physical activity . Advanced directives . List of other physicians . Hospitalizations, surgeries, and ER visits in previous 12 months . Vitals . Screenings to include cognitive, depression, and falls . Referrals and appointments  In addition, I have reviewed and discussed with [name] certain preventive protocols, quality metrics, and best practice recommendations. A written personalized care plan for preventive services as well as general preventive health recommendations is available and can be mailed to the patient at her request.      Baldomero Lamy, LPN  [id number]

## 2020-05-03 DIAGNOSIS — N952 Postmenopausal atrophic vaginitis: Secondary | ICD-10-CM | POA: Diagnosis not present

## 2020-05-03 DIAGNOSIS — Z6832 Body mass index (BMI) 32.0-32.9, adult: Secondary | ICD-10-CM | POA: Diagnosis not present

## 2020-05-03 DIAGNOSIS — Z124 Encounter for screening for malignant neoplasm of cervix: Secondary | ICD-10-CM | POA: Diagnosis not present

## 2020-05-03 DIAGNOSIS — Z1231 Encounter for screening mammogram for malignant neoplasm of breast: Secondary | ICD-10-CM | POA: Diagnosis not present

## 2020-08-28 ENCOUNTER — Other Ambulatory Visit: Payer: Self-pay | Admitting: Nurse Practitioner

## 2020-08-28 DIAGNOSIS — F5101 Primary insomnia: Secondary | ICD-10-CM

## 2020-08-28 DIAGNOSIS — I10 Essential (primary) hypertension: Secondary | ICD-10-CM

## 2020-09-05 ENCOUNTER — Ambulatory Visit (INDEPENDENT_AMBULATORY_CARE_PROVIDER_SITE_OTHER): Payer: Medicare Other | Admitting: Nurse Practitioner

## 2020-09-05 ENCOUNTER — Other Ambulatory Visit: Payer: Self-pay

## 2020-09-05 ENCOUNTER — Encounter: Payer: Self-pay | Admitting: Nurse Practitioner

## 2020-09-05 VITALS — BP 132/77 | HR 67 | Temp 97.9°F | Ht 66.0 in | Wt 192.0 lb

## 2020-09-05 DIAGNOSIS — I10 Essential (primary) hypertension: Secondary | ICD-10-CM | POA: Diagnosis not present

## 2020-09-05 DIAGNOSIS — F3342 Major depressive disorder, recurrent, in full remission: Secondary | ICD-10-CM | POA: Diagnosis not present

## 2020-09-05 DIAGNOSIS — R7303 Prediabetes: Secondary | ICD-10-CM | POA: Insufficient documentation

## 2020-09-05 DIAGNOSIS — Z6829 Body mass index (BMI) 29.0-29.9, adult: Secondary | ICD-10-CM | POA: Diagnosis not present

## 2020-09-05 DIAGNOSIS — F5101 Primary insomnia: Secondary | ICD-10-CM | POA: Diagnosis not present

## 2020-09-05 LAB — BAYER DCA HB A1C WAIVED: HB A1C (BAYER DCA - WAIVED): 5.9 % (ref <7.0)

## 2020-09-05 MED ORDER — ZOLPIDEM TARTRATE 10 MG PO TABS: 10.0000 mg | ORAL_TABLET | Freq: Every evening | ORAL | 5 refills | Status: DC | PRN

## 2020-09-05 MED ORDER — AMLODIPINE BESYLATE 5 MG PO TABS: 5.0000 mg | ORAL_TABLET | Freq: Every day | ORAL | 1 refills | Status: DC

## 2020-09-05 MED ORDER — SERTRALINE HCL 100 MG PO TABS: 100.0000 mg | ORAL_TABLET | Freq: Every day | ORAL | 1 refills | Status: DC

## 2020-09-05 NOTE — Progress Notes (Signed)
Subjective:    Patient ID: Marissa Wells, female    DOB: 05-Aug-1951, 69 y.o.   MRN: [id number]   Chief Complaint: Hypertension (6 mos ckup), Depression, Insomnia, and Thyroid Problem    HPI:  1. Primary insomnia Takes Ambien nightly and has no complaints sleeping.   2. Recurrent major depressive disorder, in full remission [facility])   Office Visit from 09/05/2020 in [facility]  PHQ-9 Total Score 1    Takes Zoloft daily and has no complaints at this time.    3. Essential hypertension BP Readings from Last 3 Encounters:  09/05/20 132/77  02/06/19 136/80  05/02/18 121/68   Does not check BP at home. Takes medication as prescribed. Denies chest pain or SOB. Avoids high salt foods.   4. BMI 29.0-29.9,adult Wt Readings from Last 3 Encounters:  02/06/19 190 lb (86.2 kg)  05/02/18 201 lb 12.8 oz (91.5 kg)  08/17/17 203 lb (92.1 kg)   BMI Readings from Last 3 Encounters:  02/06/19 30.67 kg/m  05/02/18 32.57 kg/m  08/17/17 32.77 kg/m   Just rejoined a yoga class and walks at home.     Outpatient Encounter Medications as of 09/05/2020  Medication Sig  . amLODipine (NORVASC) 5 MG tablet TAKE 1 TABLET EVERY DAY  . aspirin 81 MG tablet Take 81 mg by mouth daily.  . Calcium-Magnesium-Vitamin D 600-40-500 MG-MG-UNIT TB24 Take by mouth.  . levothyroxine (SYNTHROID) 75 MCG tablet Take 1 tablet (75 mcg total) by mouth daily.  Marland Kitchen nystatin-triamcinolone (MYCOLOG II) cream Apply 1 application topically 2 (two) times daily.  . sertraline (ZOLOFT) 100 MG tablet Take 1 tablet (100 mg total) by mouth daily.  Marland Kitchen zolpidem (AMBIEN) 10 MG tablet Take 1 tablet (10 mg total) by mouth at bedtime as needed for sleep.   No facility-administered encounter medications on file as of 09/05/2020.    Past Surgical History:  Procedure Laterality Date  . CHOLECYSTECTOMY    . TUBAL LIGATION      Family History  Problem Relation Age of Onset  . Breast cancer Sister   . Hypertension  Sister   . Diabetes Father   . Hyperlipidemia Father   . Hypertension Daughter   . Diabetes Son   . Hypertension Son   . Hypertension Sister     New complaints: States she may have been bit by bug 4 days ago, swelling to right side of nose, states taking Aleve with relief and decreased swelling.    Social history: Lives at home with husband, yoga, visit friends, plays cornhole, visits family  Controlled substance contract: n/a    Review of Systems  Constitutional: Negative.   HENT: Positive for sinus pressure and sinus pain.   Eyes: Negative.   Respiratory: Negative.   Cardiovascular: Negative.   Gastrointestinal: Negative.   Endocrine: Negative.   Genitourinary: Negative.   Musculoskeletal: Negative.   Skin: Negative.   Allergic/Immunologic: Negative.   Neurological: Negative.   Hematological: Negative.   Psychiatric/Behavioral: Negative.   All other systems reviewed and are negative.      Objective:   Physical Exam Vitals and nursing note reviewed.  Constitutional:      Appearance: Normal appearance.  HENT:     Head: Normocephalic and atraumatic.     Right Ear: Tympanic membrane, ear canal and external ear normal.     Left Ear: Tympanic membrane, ear canal and external ear normal.     Nose: Nose normal.     Right Turbinates: Swollen.  Mouth/Throat:     Mouth: Mucous membranes are moist.     Pharynx: Oropharynx is clear.  Eyes:     Extraocular Movements: Extraocular movements intact.     Conjunctiva/sclera: Conjunctivae normal.     Pupils: Pupils are equal, round, and reactive to light.  Cardiovascular:     Rate and Rhythm: Normal rate and regular rhythm.     Pulses: Normal pulses.     Heart sounds: Normal heart sounds.  Pulmonary:     Effort: Pulmonary effort is normal.     Breath sounds: Normal breath sounds.  Abdominal:     General: Abdomen is flat.     Palpations: Abdomen is soft.  Musculoskeletal:        General: Normal range of motion.      Cervical back: Normal range of motion.  Skin:    General: Skin is warm and dry.     Capillary Refill: Capillary refill takes less than 2 seconds.  Neurological:     General: No focal deficit present.     Mental Status: She is alert and oriented to person, place, and time. Mental status is at baseline.  Psychiatric:        Mood and Affect: Mood normal.        Behavior: Behavior normal.        Thought Content: Thought content normal.        Judgment: Judgment normal.    BP 132/77   Pulse 67   Temp 97.9 F (36.6 C) (Temporal)   Ht 5\' 6"  (1.676 m)   Wt 192 lb (87.1 kg)   BMI 30.99 kg/m       Assessment & Plan:  Marissa Wells comes in today with chief complaint of Hypertension (6 mos ckup), Depression, Insomnia, Thyroid Problem, and Mass (right side of nose, gave her a headache)   Diagnosis and orders addressed:  1. Primary insomnia Report any new or worsening symptoms. Go to bed and wake up at the same time every day to establish good sleep hygiene.   2. Recurrent major depressive disorder, in full remission (HCC) Report any new or worsening symptoms. Take medication as prescribed. Practice stress management.   3. Essential hypertension Check blood pressure regularly and take medication as prescribed. Eat heart healthy foods and avoid foods high in salt.   4. BMI 29.0-29.9,adult Stay active, walking and swimming are great low-impact cardiovascular exercises that decrease blood pressure and help you maintain a healthy weight.    Labs pending Health Maintenance reviewed Diet and exercise encouraged  Follow up plan: Follow up in 6 months   Mary-Margaret 09-25-1984, FNP

## 2020-09-05 NOTE — Patient Instructions (Signed)
Carbohydrate Counting for Diabetes Mellitus, Adult  Carbohydrate counting is a method of keeping track of how many carbohydrates you eat. Eating carbohydrates naturally increases the amount of sugar (glucose) in the blood. Counting how many carbohydrates you eat helps keep your blood glucose within normal limits, which helps you manage your diabetes (diabetes mellitus). It is important to know how many carbohydrates you can safely have in each meal. This is different for every person. A diet and nutrition specialist (registered dietitian) can help you make a meal plan and calculate how many carbohydrates you should have at each meal and snack. Carbohydrates are found in the following foods:  Grains, such as breads and cereals.  Dried beans and soy products.  Starchy vegetables, such as potatoes, peas, and corn.  Fruit and fruit juices.  Milk and yogurt.  Sweets and snack foods, such as cake, cookies, candy, chips, and soft drinks. How do I count carbohydrates? There are two ways to count carbohydrates in food. You can use either of the methods or a combination of both. Reading "Nutrition Facts" on packaged food The "Nutrition Facts" list is included on the labels of almost all packaged foods and beverages in the [place] It includes:  The serving size.  Information about nutrients in each serving, including the grams (g) of carbohydrate per serving. To use the "Nutrition Facts":  Decide how many servings you will have.  Multiply the number of servings by the number of carbohydrates per serving.  The resulting number is the total amount of carbohydrates that you will be having. Learning standard serving sizes of other foods When you eat carbohydrate foods that are not packaged or do not include "Nutrition Facts" on the label, you need to measure the servings in order to count the amount of carbohydrates:  Measure the foods that you will eat with a food scale or measuring cup, if  needed.  Decide how many standard-size servings you will eat.  Multiply the number of servings by 15. Most carbohydrate-rich foods have about 15 g of carbohydrates per serving. ? For example, if you eat 8 oz (170 g) of strawberries, you will have eaten 2 servings and 30 g of carbohydrates (2 servings x 15 g = 30 g).  For foods that have more than one food mixed, such as soups and casseroles, you must count the carbohydrates in each food that is included. The following list contains standard serving sizes of common carbohydrate-rich foods. Each of these servings has about 15 g of carbohydrates:   hamburger bun or  English muffin.   oz (15 mL) syrup.   oz (14 g) jelly.  1 slice of bread.  1 six-inch tortilla.  3 oz (85 g) cooked rice or pasta.  4 oz (113 g) cooked dried beans.  4 oz (113 g) starchy vegetable, such as peas, corn, or potatoes.  4 oz (113 g) hot cereal.  4 oz (113 g) mashed potatoes or  of a large baked potato.  4 oz (113 g) canned or frozen fruit.  4 oz (120 mL) fruit juice.  4-6 crackers.  6 chicken nuggets.  6 oz (170 g) unsweetened dry cereal.  6 oz (170 g) plain fat-free yogurt or yogurt sweetened with artificial sweeteners.  8 oz (240 mL) milk.  8 oz (170 g) fresh fruit or one small piece of fruit.  24 oz (680 g) popped popcorn. Example of carbohydrate counting Sample meal  3 oz (85 g) chicken breast.  6 oz (170 g)   brown rice.  4 oz (113 g) corn.  8 oz (240 mL) milk.  8 oz (170 g) strawberries with sugar-free whipped topping. Carbohydrate calculation 1. Identify the foods that contain carbohydrates: ? Rice. ? Corn. ? Milk. ? Strawberries. 2. Calculate how many servings you have of each food: ? 2 servings rice. ? 1 serving corn. ? 1 serving milk. ? 1 serving strawberries. 3. Multiply each number of servings by 15 g: ? 2 servings rice x 15 g = 30 g. ? 1 serving corn x 15 g = 15 g. ? 1 serving milk x 15 g = 15 g. ? 1  serving strawberries x 15 g = 15 g. 4. Add together all of the amounts to find the total grams of carbohydrates eaten: ? 30 g + 15 g + 15 g + 15 g = 75 g of carbohydrates total. Summary  Carbohydrate counting is a method of keeping track of how many carbohydrates you eat.  Eating carbohydrates naturally increases the amount of sugar (glucose) in the blood.  Counting how many carbohydrates you eat helps keep your blood glucose within normal limits, which helps you manage your diabetes.  A diet and nutrition specialist (registered dietitian) can help you make a meal plan and calculate how many carbohydrates you should have at each meal and snack. This information is not intended to replace advice given to you by your health care provider. Make sure you discuss any questions you have with your health care provider. Document Revised: 07/08/2017 Document Reviewed: 05/27/2016 Elsevier Patient Education  2020 [employer]  

## 2020-09-06 LAB — CMP14+EGFR
ALT: 11 IU/L (ref 0–32)
AST: 16 IU/L (ref 0–40)
Albumin/Globulin Ratio: 1.4 (ref 1.2–2.2)
Albumin: 4.3 g/dL (ref 3.8–4.8)
Alkaline Phosphatase: 128 IU/L — ABNORMAL HIGH (ref 48–121)
BUN/Creatinine Ratio: 19 (ref 12–28)
BUN: 14 mg/dL (ref 8–27)
Bilirubin Total: <0.2 mg/dL (ref 0.0–1.2)
CO2: 22 mmol/L (ref 20–29)
Calcium: 9.1 mg/dL (ref 8.7–10.3)
Chloride: 106 mmol/L (ref 96–106)
Creatinine, Ser: 0.75 mg/dL (ref 0.57–1.00)
GFR calc Af Amer: 95 mL/min/{1.73_m2} (ref >59)
GFR calc non Af Amer: 83 mL/min/{1.73_m2} (ref >59)
Globulin, Total: 3.1 g/dL (ref 1.5–4.5)
Glucose: 101 mg/dL — ABNORMAL HIGH (ref 65–99)
Potassium: 4.5 mmol/L (ref 3.5–5.2)
Sodium: 140 mmol/L (ref 134–144)
Total Protein: 7.4 g/dL (ref 6.0–8.5)

## 2020-09-06 LAB — CBC WITH DIFFERENTIAL/PLATELET
Basophils Absolute: 0.1 10*3/uL (ref 0.0–0.2)
Basos: 1 %
EOS (ABSOLUTE): 0.3 10*3/uL (ref 0.0–0.4)
Eos: 4 %
Hematocrit: 37.2 % (ref 34.0–46.6)
Hemoglobin: 12.4 g/dL (ref 11.1–15.9)
Immature Grans (Abs): 0 10*3/uL (ref 0.0–0.1)
Immature Granulocytes: 0 %
Lymphocytes Absolute: 3.3 10*3/uL — ABNORMAL HIGH (ref 0.7–3.1)
Lymphs: 44 %
MCH: 29.9 pg (ref 26.6–33.0)
MCHC: 33.3 g/dL (ref 31.5–35.7)
MCV: 90 fL (ref 79–97)
Monocytes Absolute: 0.5 10*3/uL (ref 0.1–0.9)
Monocytes: 7 %
Neutrophils Absolute: 3.3 10*3/uL (ref 1.4–7.0)
Neutrophils: 44 %
Platelets: 359 10*3/uL (ref 150–450)
RBC: 4.15 x10E6/uL (ref 3.77–5.28)
RDW: 14.7 % (ref 11.7–15.4)
WBC: 7.4 10*3/uL (ref 3.4–10.8)

## 2020-09-06 LAB — LIPID PANEL
Chol/HDL Ratio: 3.9 ratio (ref 0.0–4.4)
Cholesterol, Total: 208 mg/dL — ABNORMAL HIGH (ref 100–199)
HDL: 54 mg/dL (ref >39)
LDL Chol Calc (NIH): 134 mg/dL — ABNORMAL HIGH (ref 0–99)
Triglycerides: 112 mg/dL (ref 0–149)
VLDL Cholesterol Cal: 20 mg/dL (ref 5–40)

## 2021-01-16 LAB — HM DIABETES EYE EXAM

## 2021-03-10 ENCOUNTER — Encounter: Payer: Self-pay | Admitting: Nurse Practitioner

## 2021-03-10 ENCOUNTER — Ambulatory Visit (INDEPENDENT_AMBULATORY_CARE_PROVIDER_SITE_OTHER): Payer: Medicare Other | Admitting: Nurse Practitioner

## 2021-03-10 ENCOUNTER — Other Ambulatory Visit: Payer: Self-pay

## 2021-03-10 VITALS — BP 127/79 | HR 65 | Temp 98.0°F | Resp 20 | Ht 66.0 in | Wt 188.0 lb

## 2021-03-10 DIAGNOSIS — E782 Mixed hyperlipidemia: Secondary | ICD-10-CM | POA: Diagnosis not present

## 2021-03-10 DIAGNOSIS — F3342 Major depressive disorder, recurrent, in full remission: Secondary | ICD-10-CM | POA: Diagnosis not present

## 2021-03-10 DIAGNOSIS — F3340 Major depressive disorder, recurrent, in remission, unspecified: Secondary | ICD-10-CM | POA: Diagnosis not present

## 2021-03-10 DIAGNOSIS — F5101 Primary insomnia: Secondary | ICD-10-CM | POA: Diagnosis not present

## 2021-03-10 DIAGNOSIS — I10 Essential (primary) hypertension: Secondary | ICD-10-CM | POA: Diagnosis not present

## 2021-03-10 DIAGNOSIS — E039 Hypothyroidism, unspecified: Secondary | ICD-10-CM

## 2021-03-10 DIAGNOSIS — E119 Type 2 diabetes mellitus without complications: Secondary | ICD-10-CM | POA: Diagnosis not present

## 2021-03-10 DIAGNOSIS — Z6829 Body mass index (BMI) 29.0-29.9, adult: Secondary | ICD-10-CM

## 2021-03-10 LAB — BAYER DCA HB A1C WAIVED: HB A1C (BAYER DCA - WAIVED): 5.8 % (ref <7.0)

## 2021-03-10 MED ORDER — ZOLPIDEM TARTRATE 10 MG PO TABS: 10.0000 mg | ORAL_TABLET | Freq: Every evening | ORAL | 5 refills | Status: DC | PRN

## 2021-03-10 MED ORDER — ROSUVASTATIN CALCIUM 5 MG PO TABS: 5.0000 mg | ORAL_TABLET | Freq: Every day | ORAL | 1 refills | Status: DC

## 2021-03-10 MED ORDER — AMLODIPINE BESYLATE 5 MG PO TABS: 5.0000 mg | ORAL_TABLET | Freq: Every day | ORAL | 1 refills | Status: DC

## 2021-03-10 MED ORDER — SERTRALINE HCL 100 MG PO TABS: 100.0000 mg | ORAL_TABLET | Freq: Every day | ORAL | 1 refills | Status: DC

## 2021-03-10 NOTE — Progress Notes (Signed)
Subjective:    Patient ID: Marissa Wells, female    DOB: 01/12/1951, 70 y.o.   MRN: [id number]   Chief Complaint: Medical Management of Chronic Issues    HPI:  1. Primary hypertension No c/o chest pain, sob or headache. Does not check blood pressure at home. BP Readings from Last 3 Encounters:  03/10/21 127/79  09/05/20 132/77  02/06/19 136/80     2. Mixed hyperlipidemia Does not watch diet and does no dedicated exercise. Lab Results  Component Value Date   CHOL 208 (H) 09/05/2020   HDL 54 09/05/2020   LDLCALC 134 (H) 09/05/2020   TRIG 112 09/05/2020   CHOLHDL 3.9 09/05/2020  The 10-year ASCVD risk score Mikey Bussing DC Jr., et al., 2013) is: 23.2%   3. Diabetes mellitus without complication (HCC) Fasting blood sugars are not checked at home. She has not really been watching her diet. Lab Results  Component Value Date   HGBA1C 5.9 09/05/2020     4. Acquired hypothyroidism No problems that she is aware of. Lab Results  Component Value Date   TSH 5.370 (H) 02/29/2020     5. Recurrent major depressive disorder, in remission ([place]) Is on zoloft and is doing well.  Depression screen [facility] 2/9 03/10/2021 09/05/2020 03/19/2020  Decreased Interest 0 0 0  Down, Depressed, Hopeless 0 0 1  PHQ - 2 Score 0 0 1  Altered sleeping _0 Tired, decreased energy 0 0 0  Change in appetite 0 0 1  Feeling bad or failure about yourself  0 0 0  Trouble concentrating 0 0 0  Moving slowly or fidgety/restless 0 0 0  Suicidal thoughts 0 0 0  PHQ-9 Score _1 Difficult doing work/chores Not difficult at all - Not difficult at all     6. Primary insomnia Takes ambien to sleep at night. Sleeps about 6-7 hours a night  7. BMI 29.0-29.9, Has lost 4 lbs since last visit Wt Readings from Last 3 Encounters:  03/10/21 188 lb (85.3 kg)  09/05/20 192 lb (87.1 kg)  02/06/19 190 lb (86.2 kg)   BMI Readings from Last 3 Encounters:  03/10/21 30.34 kg/m  09/05/20 30.99 kg/m  02/06/19 30.67  kg/m       Outpatient Encounter Medications as of 03/10/2021  Medication Sig  . amLODipine (NORVASC) 5 MG tablet Take 1 tablet (5 mg total) by mouth daily.  Marland Kitchen aspirin 81 MG tablet Take 81 mg by mouth daily.  . Calcium-Magnesium-Vitamin D [id number] MG-MG-UNIT TB24 Take by mouth.  . levothyroxine (SYNTHROID) 75 MCG tablet Take 1 tablet (75 mcg total) by mouth daily.  Marland Kitchen nystatin-triamcinolone (MYCOLOG II) cream Apply 1 application topically 2 (two) times daily.  . sertraline (ZOLOFT) 100 MG tablet Take 1 tablet (100 mg total) by mouth daily.  Marland Kitchen zolpidem (AMBIEN) 10 MG tablet Take 1 tablet (10 mg total) by mouth at bedtime as needed for sleep.     Past Surgical History:  Procedure Laterality Date  . CHOLECYSTECTOMY    . TUBAL LIGATION      Family History  Problem Relation Age of Onset  . Breast cancer Sister   . Hypertension Sister   . Diabetes Father   . Hyperlipidemia Father   . Hypertension Daughter   . Diabetes Son   . Hypertension Son   . Hypertension Sister     New complaints: none  Social history: Lives with her husband  Controlled substance contract: n/a  Review of Systems  Constitutional: Negative for diaphoresis.  Eyes: Negative for pain.  Respiratory: Negative for shortness of breath.   Cardiovascular: Negative for chest pain, palpitations and leg swelling.  Gastrointestinal: Negative for abdominal pain.  Endocrine: Negative for polydipsia.  Skin: Negative for rash.  Neurological: Negative for dizziness, weakness and headaches.  Hematological: Does not bruise/bleed easily.  All other systems reviewed and are negative.      Objective:   Physical Exam Vitals and nursing note reviewed.  Constitutional:      General: She is not in acute distress.    Appearance: Normal appearance. She is well-developed.  HENT:     Head: Normocephalic.     Nose: Nose normal.  Eyes:     Pupils: Pupils are equal, round, and reactive to light.  Neck:      Vascular: No carotid bruit or JVD.  Cardiovascular:     Rate and Rhythm: Normal rate and regular rhythm.     Heart sounds: Normal heart sounds.  Pulmonary:     Effort: Pulmonary effort is normal. No respiratory distress.     Breath sounds: Normal breath sounds. No wheezing or rales.  Chest:     Chest wall: No tenderness.  Abdominal:     General: Bowel sounds are normal. There is no distension or abdominal bruit.     Palpations: Abdomen is soft. There is no hepatomegaly, splenomegaly, mass or pulsatile mass.     Tenderness: There is no abdominal tenderness.  Musculoskeletal:        General: Normal range of motion.     Cervical back: Normal range of motion and neck supple.  Lymphadenopathy:     Cervical: No cervical adenopathy.  Skin:    General: Skin is warm and dry.  Neurological:     Mental Status: She is alert and oriented to person, place, and time.     Deep Tendon Reflexes: Reflexes are normal and symmetric.  Psychiatric:        Behavior: Behavior normal.        Thought Content: Thought content normal.        Judgment: Judgment normal.     BP 127/79   Pulse 65   Temp 98 F (36.7 C) (Temporal)   Resp 20   Ht 5' 6" (1.676 m)   Wt 188 lb (85.3 kg)   SpO2 97%   BMI 30.34 kg/m       Assessment & Plan:  Marissa Wells comes in today with chief complaint of Medical Management of Chronic Issues   Diagnosis and orders addressed:  1. Primary hypertension Low sodium diet - CBC with Differential/Platelet - CMP14+EGFR - amLODipine (NORVASC) 5 MG tablet; Take 1 tablet (5 mg total) by mouth daily.  Dispense: 90 tablet; Refill: 1  2. Mixed hyperlipidemia Low fat diet - Lipid panel - rosuvastatin (CRESTOR) 5 MG tablet; Take 1 tablet (5 mg total) by mouth daily.  Dispense: 90 tablet; Refill: 1  3. Diabetes mellitus without complication ([place]) Continue to watch carbs in diet - Bayer DCA Hb A1c Waived  4. Acquired hypothyroidism lbas pending - Thyroid Panel With  TSH  5. Recurrent major depressive disorder, in remission ([place]) Stress management - sertraline (ZOLOFT) 100 MG tablet; Take 1 tablet (100 mg total) by mouth daily.  Dispense: 90 tablet; Refill: 1  6. Primary insomnia Bedtime routine - zolpidem (AMBIEN) 10 MG tablet; Take 1 tablet (10 mg total) by mouth at bedtime as needed for sleep.  Dispense: 30  tablet; Refill: 5  7. BMI 29.0-29.9,adult Discussed diet and exercise for person with BMI >25 Will recheck weight in 3-6 months    Labs pending Health Maintenance reviewed Diet and exercise encouraged  Follow up plan: 6 months   Mary-Margaret Hassell Done, FNP

## 2021-03-10 NOTE — Patient Instructions (Signed)
Textbook of family medicine (9th ed., pp. 8638-1771). [place], [place]: [employer].">  Stress, Adult Stress is a normal reaction to life events. Stress is what you feel when life demands more than you are used to, or more than you think you can handle. Some stress can be useful, such as studying for a test or meeting a deadline at work. Stress that occurs too often or for too long can cause problems. It can affect your emotional health and interfere with relationships and normal daily activities. Too much stress can weaken your body's defense system (immune system) and increase your risk for physical illness. If you already have a medical problem, stress can make it worse. What are the causes? All sorts of life events can cause stress. An event that causes stress for one person may not be stressful for another person. Major life events, whether positive or negative, commonly cause stress. Examples include:  Losing a job or starting a new job.  Losing a loved one.  Moving to a new town or home.  Getting married or divorced.  Having a baby.  Getting injured or sick. Less obvious life events can also cause stress, especially if they occur day after day or in combination with each other. Examples include:  Working long hours.  Driving in traffic.  Caring for children.  Being in debt.  Being in a difficult relationship. What are the signs or symptoms? Stress can cause emotional symptoms, including:  Anxiety. This is feeling worried, afraid, on edge, overwhelmed, or out of control.  Anger, including irritation or impatience.  Depression. This is feeling sad, down, helpless, or guilty.  Trouble focusing, remembering, or making decisions. Stress can cause physical symptoms, including:  Aches and pains. These may affect your head, neck, back, stomach, or other areas of your body.  Tight muscles or a clenched jaw.  Low energy.  Trouble sleeping. Stress can cause unhealthy  behaviors, including:  Eating to feel better (overeating) or skipping meals.  Working too much or putting off tasks.  Smoking, drinking alcohol, or using drugs to feel better. How is this diagnosed? Stress is diagnosed through an assessment by your health care provider. He or she may diagnose this condition based on:  Your symptoms and any stressful life events.  Your medical history.  Tests to rule out other causes of your symptoms. Depending on your condition, your health care provider may refer you to a specialist for further evaluation. How is this treated? Stress management techniques are the recommended treatment for stress. Medicine is not typically recommended for the treatment of stress. Techniques to reduce your reaction to stressful life events include:  Stress identification. Monitor yourself for symptoms of stress and identify what causes stress for you. These skills may help you to avoid or prepare for stressful events.  Time management. Set your priorities, keep a calendar of events, and learn to say no. Taking these actions can help you avoid making too many commitments. Techniques for coping with stress include:  Rethinking the problem. Try to think realistically about stressful events rather than ignoring them or overreacting. Try to find the positives in a stressful situation rather than focusing on the negatives.  Exercise. Physical exercise can release both physical and emotional tension. The key is to find a form of exercise that you enjoy and do it regularly.  Relaxation techniques. These relax the body and mind. The key is to find one or more that you enjoy and use the techniques regularly. Examples include: ?  Meditation, deep breathing, or progressive relaxation techniques. ? Yoga or tai chi. ? Biofeedback, mindfulness techniques, or journaling. ? Listening to music, being out in nature, or participating in other hobbies.  Practicing a healthy lifestyle.  Eat a balanced diet, drink plenty of water, limit or avoid caffeine, and get plenty of sleep.  Having a strong support network. Spend time with family, friends, or other people you enjoy being around. Express your feelings and talk things over with someone you trust. Counseling or talk therapy with a mental health professional may be helpful if you are having trouble managing stress on your own.   Follow these instructions at home: Lifestyle  Avoid drugs.  Do not use any products that contain nicotine or tobacco, such as cigarettes, e-cigarettes, and chewing tobacco. If you need help quitting, ask your health care provider.  Limit alcohol intake to no more than 1 drink a day for nonpregnant women and 2 drinks a day for men. One drink equals 12 oz of beer, 5 oz of wine, or 1 oz of hard liquor  Do not use alcohol or drugs to relax.  Eat a balanced diet that includes fresh fruits and vegetables, whole grains, lean meats, fish, eggs, and beans, and low-fat dairy. Avoid processed foods and foods high in added fat, sugar, and salt.  Exercise at least 30 minutes on 5 or more days each week.  Get 7-8 hours of sleep each night.   General instructions  Practice stress management techniques as discussed with your health care provider.  Drink enough fluid to keep your urine clear or pale yellow.  Take over-the-counter and prescription medicines only as told by your health care provider.  Keep all follow-up visits as told by your health care provider. This is important.   Contact a health care provider if:  Your symptoms get worse.  You have new symptoms.  You feel overwhelmed by your problems and can no longer manage them on your own. Get help right away if:  You have thoughts of hurting yourself or others. If you ever feel like you may hurt yourself or others, or have thoughts about taking your own life, get help right away. You can go to your nearest emergency department or  call:  Your local emergency services (911 in the [place]  A suicide crisis helpline, such as the [facility] at (404)619-9403. This is open 24 hours a day. Summary  Stress is a normal reaction to life events. It can cause problems if it happens too often or for too long.  Practicing stress management techniques is the best way to treat stress.  Counseling or talk therapy with a mental health professional may be helpful if you are having trouble managing stress on your own. This information is not intended to replace advice given to you by your health care provider. Make sure you discuss any questions you have with your health care provider. Document Revised: 08/30/2020 Document Reviewed: 08/30/2020 Elsevier Patient Education  2021 [employer].

## 2021-03-11 LAB — CMP14+EGFR
ALT: 16 IU/L (ref 0–32)
AST: 23 IU/L (ref 0–40)
Albumin/Globulin Ratio: 1.4 (ref 1.2–2.2)
Albumin: 4.3 g/dL (ref 3.8–4.8)
Alkaline Phosphatase: 134 IU/L — ABNORMAL HIGH (ref 44–121)
BUN/Creatinine Ratio: 16 (ref 12–28)
BUN: 14 mg/dL (ref 8–27)
Bilirubin Total: 0.2 mg/dL (ref 0.0–1.2)
CO2: 19 mmol/L — ABNORMAL LOW (ref 20–29)
Calcium: 9.4 mg/dL (ref 8.7–10.3)
Chloride: 107 mmol/L — ABNORMAL HIGH (ref 96–106)
Creatinine, Ser: 0.85 mg/dL (ref 0.57–1.00)
Globulin, Total: 3.1 g/dL (ref 1.5–4.5)
Glucose: 92 mg/dL (ref 65–99)
Potassium: 4 mmol/L (ref 3.5–5.2)
Sodium: 143 mmol/L (ref 134–144)
Total Protein: 7.4 g/dL (ref 6.0–8.5)
eGFR: 75 mL/min/{1.73_m2} (ref >59)

## 2021-03-11 LAB — CBC WITH DIFFERENTIAL/PLATELET
Basophils Absolute: 0 10*3/uL (ref 0.0–0.2)
Basos: 0 %
EOS (ABSOLUTE): 0.4 10*3/uL (ref 0.0–0.4)
Eos: 5 %
Hematocrit: 38.4 % (ref 34.0–46.6)
Hemoglobin: 13 g/dL (ref 11.1–15.9)
Immature Grans (Abs): 0 10*3/uL (ref 0.0–0.1)
Immature Granulocytes: 0 %
Lymphocytes Absolute: 3.7 10*3/uL — ABNORMAL HIGH (ref 0.7–3.1)
Lymphs: 52 %
MCH: 30.1 pg (ref 26.6–33.0)
MCHC: 33.9 g/dL (ref 31.5–35.7)
MCV: 89 fL (ref 79–97)
Monocytes Absolute: 0.5 10*3/uL (ref 0.1–0.9)
Monocytes: 7 %
Neutrophils Absolute: 2.6 10*3/uL (ref 1.4–7.0)
Neutrophils: 36 %
Platelets: 342 10*3/uL (ref 150–450)
RBC: 4.32 x10E6/uL (ref 3.77–5.28)
RDW: 14.3 % (ref 11.7–15.4)
WBC: 7.1 10*3/uL (ref 3.4–10.8)

## 2021-03-11 LAB — LIPID PANEL
Chol/HDL Ratio: 3.3 ratio (ref 0.0–4.4)
Cholesterol, Total: 174 mg/dL (ref 100–199)
HDL: 52 mg/dL (ref >39)
LDL Chol Calc (NIH): 105 mg/dL — ABNORMAL HIGH (ref 0–99)
Triglycerides: 91 mg/dL (ref 0–149)
VLDL Cholesterol Cal: 17 mg/dL (ref 5–40)

## 2021-03-11 LAB — THYROID PANEL WITH TSH
Free Thyroxine Index: 2.3 (ref 1.2–4.9)
T3 Uptake Ratio: 28 % (ref 24–39)
T4, Total: 8.3 ug/dL (ref 4.5–12.0)
TSH: 2.12 u[IU]/mL (ref 0.450–4.500)

## 2021-03-17 ENCOUNTER — Other Ambulatory Visit: Payer: Self-pay | Admitting: Family Medicine

## 2021-03-19 ENCOUNTER — Other Ambulatory Visit: Payer: Self-pay | Admitting: Nurse Practitioner

## 2021-03-19 DIAGNOSIS — E039 Hypothyroidism, unspecified: Secondary | ICD-10-CM

## 2021-04-15 ENCOUNTER — Other Ambulatory Visit: Payer: Self-pay

## 2021-04-15 ENCOUNTER — Encounter: Payer: Self-pay | Admitting: Family Medicine

## 2021-04-15 ENCOUNTER — Ambulatory Visit (INDEPENDENT_AMBULATORY_CARE_PROVIDER_SITE_OTHER): Payer: Medicare Other | Admitting: Family Medicine

## 2021-04-15 VITALS — BP 131/68 | HR 70 | Temp 97.7°F | Ht 66.0 in | Wt 187.0 lb

## 2021-04-15 DIAGNOSIS — L509 Urticaria, unspecified: Secondary | ICD-10-CM

## 2021-04-15 MED ORDER — BETAMETHASONE SOD PHOS & ACET 6 (3-3) MG/ML IJ SUSP
6.0000 mg | Freq: Once | INTRAMUSCULAR | Status: AC
  Administered 2021-04-15: 6 mg via INTRAMUSCULAR

## 2021-04-15 NOTE — Progress Notes (Signed)
Chief Complaint  Patient presents with  . Rash    HPI  Patient presents today for One week of itching rash not responding to benadryl PO and cortisone topical cream. Outdoors a lot. No specific exposures.   PMH: Smoking status noted ROS: Per HPI  Objective: BP 131/68   Pulse 70   Temp 97.7 F (36.5 C)   Ht 5\' 6"  (1.676 m)   Wt 187 lb (84.8 kg)   SpO2 98%   BMI 30.18 kg/m  Gen: NAD, alert, cooperative with exam HEENT: NCAT, EOMI, PERRL Skin - urticarial blanching eruption on forearms and torso. NEar confluence on back. Ext: No edema, warm Neuro: Alert and oriented, No gross deficits  Assessment and plan:  1. Urticaria     Meds ordered this encounter  Medications  . betamethasone acetate-betamethasone sodium phosphate (CELESTONE) injection 6 mg    OTC benadryl or Zyrtec plus topical 0.1% cortisone cream topicaly prn for the itching.  Follow up as needed.  , MD

## 2021-04-30 DIAGNOSIS — L439 Lichen planus, unspecified: Secondary | ICD-10-CM | POA: Diagnosis not present

## 2021-04-30 DIAGNOSIS — L438 Other lichen planus: Secondary | ICD-10-CM | POA: Diagnosis not present

## 2021-05-15 DIAGNOSIS — L438 Other lichen planus: Secondary | ICD-10-CM | POA: Diagnosis not present

## 2021-09-08 ENCOUNTER — Ambulatory Visit (INDEPENDENT_AMBULATORY_CARE_PROVIDER_SITE_OTHER): Payer: Medicare Other

## 2021-09-08 VITALS — Ht 66.0 in | Wt 186.0 lb

## 2021-09-08 DIAGNOSIS — Z0000 Encounter for general adult medical examination without abnormal findings: Secondary | ICD-10-CM | POA: Diagnosis not present

## 2021-09-08 NOTE — Patient Instructions (Signed)
[name] , Thank you for taking time to come for your Medicare Wellness Visit. I appreciate your ongoing commitment to your health goals. Please review the following plan we discussed and let me know if I can assist you in the future.   Screening recommendations/referrals: Colonoscopy: Cologuard Done 02/15/2019 - Repeat every 3 years Mammogram: Done 05/03/2020 - Repeat annually *Due - make appointment soon Bone Density: Done 01/29/2017 - Repeat in 5 years Recommended yearly ophthalmology/optometry visit for glaucoma screening and checkup Recommended yearly dental visit for hygiene and checkup  Vaccinations: Influenza vaccine: Due every fall Pneumococcal vaccine: Due. 2 doses one year apart Tdap vaccine: Due. Every10 years Shingles vaccine: Done 11/2020 & 03/2021 Covid-19: Done - we need dates - please bring your card  Advanced directives: Advance directive discussed with you today. I have provided a copy for you to complete at home and have notarized. Once this is complete please bring a copy in to our office so we can scan it into your chart.   Conditions/risks identified: Aim for 30 minutes of exercise or brisk walking each day, drink 6-8 glasses of water and eat lots of fruits and vegetables.   Next appointment: Follow up in one year for your annual wellness visit    Preventive Care 65 Years and Older, Female Preventive care refers to lifestyle choices and visits with your health care provider that can promote health and wellness. What does preventive care include? A yearly physical exam. This is also called an annual well check. Dental exams once or twice a year. Routine eye exams. Ask your health care provider how often you should have your eyes checked. Personal lifestyle choices, including: Daily care of your teeth and gums. Regular physical activity. Eating a healthy diet. Avoiding tobacco and drug use. Limiting alcohol use. Practicing safe sex. Taking low-dose aspirin every  day. Taking vitamin and mineral supplements as recommended by your health care provider. What happens during an annual well check? The services and screenings done by your health care provider during your annual well check will depend on your age, overall health, lifestyle risk factors, and family history of disease. Counseling  Your health care provider may ask you questions about your: Alcohol use. Tobacco use. Drug use. Emotional well-being. Home and relationship well-being. Sexual activity. Eating habits. History of falls. Memory and ability to understand (cognition). Work and work Astronomer. Reproductive health. Screening  You may have the following tests or measurements: Height, weight, and BMI. Blood pressure. Lipid and cholesterol levels. These may be checked every 5 years, or more frequently if you are over 41 years old. Skin check. Lung cancer screening. You may have this screening every year starting at age 64 if you have a 30-pack-year history of smoking and currently smoke or have quit within the past 15 years. Fecal occult blood test (FOBT) of the stool. You may have this test every year starting at age 37. Flexible sigmoidoscopy or colonoscopy. You may have a sigmoidoscopy every 5 years or a colonoscopy every 10 years starting at age 64. Hepatitis C blood test. Hepatitis B blood test. Sexually transmitted disease (STD) testing. Diabetes screening. This is done by checking your blood sugar (glucose) after you have not eaten for a while (fasting). You may have this done every 1-3 years. Bone density scan. This is done to screen for osteoporosis. You may have this done starting at age 85. Mammogram. This may be done every 1-2 years. Talk to your health care provider about how often  you should have regular mammograms. Talk with your health care provider about your test results, treatment options, and if necessary, the need for more tests. Vaccines  Your health care  provider may recommend certain vaccines, such as: Influenza vaccine. This is recommended every year. Tetanus, diphtheria, and acellular pertussis (Tdap, Td) vaccine. You may need a Td booster every 10 years. Zoster vaccine. You may need this after age 63. Pneumococcal 13-valent conjugate (PCV13) vaccine. One dose is recommended after age 52. Pneumococcal polysaccharide (PPSV23) vaccine. One dose is recommended after age 58. Talk to your health care provider about which screenings and vaccines you need and how often you need them. This information is not intended to replace advice given to you by your health care provider. Make sure you discuss any questions you have with your health care provider. Document Released: 01/10/2016 Document Revised: 09/02/2016 Document Reviewed: 10/15/2015 Elsevier Interactive Patient Education  2017 [place] Prevention in the Home Falls can cause injuries. They can happen to people of all ages. There are many things you can do to make your home safe and to help prevent falls. What can I do on the outside of my home? Regularly fix the edges of walkways and driveways and fix any cracks. Remove anything that might make you trip as you walk through a door, such as a raised step or threshold. Trim any bushes or trees on the path to your home. Use bright outdoor lighting. Clear any walking paths of anything that might make someone trip, such as rocks or tools. Regularly check to see if handrails are loose or broken. Make sure that both sides of any steps have handrails. Any raised decks and porches should have guardrails on the edges. Have any leaves, snow, or ice cleared regularly. Use sand or salt on walking paths during winter. Clean up any spills in your garage right away. This includes oil or grease spills. What can I do in the bathroom? Use night lights. Install grab bars by the toilet and in the tub and shower. Do not use towel bars as grab  bars. Use non-skid mats or decals in the tub or shower. If you need to sit down in the shower, use a plastic, non-slip stool. Keep the floor dry. Clean up any water that spills on the floor as soon as it happens. Remove soap buildup in the tub or shower regularly. Attach bath mats securely with double-sided non-slip rug tape. Do not have throw rugs and other things on the floor that can make you trip. What can I do in the bedroom? Use night lights. Make sure that you have a light by your bed that is easy to reach. Do not use any sheets or blankets that are too big for your bed. They should not hang down onto the floor. Have a firm chair that has side arms. You can use this for support while you get dressed. Do not have throw rugs and other things on the floor that can make you trip. What can I do in the kitchen? Clean up any spills right away. Avoid walking on wet floors. Keep items that you use a lot in easy-to-reach places. If you need to reach something above you, use a strong step stool that has a grab bar. Keep electrical cords out of the way. Do not use floor polish or wax that makes floors slippery. If you must use wax, use non-skid floor wax. Do not have throw rugs and other things on  the floor that can make you trip. What can I do with my stairs? Do not leave any items on the stairs. Make sure that there are handrails on both sides of the stairs and use them. Fix handrails that are broken or loose. Make sure that handrails are as long as the stairways. Check any carpeting to make sure that it is firmly attached to the stairs. Fix any carpet that is loose or worn. Avoid having throw rugs at the top or bottom of the stairs. If you do have throw rugs, attach them to the floor with carpet tape. Make sure that you have a light switch at the top of the stairs and the bottom of the stairs. If you do not have them, ask someone to add them for you. What else can I do to help prevent  falls? Wear shoes that: Do not have high heels. Have rubber bottoms. Are comfortable and fit you well. Are closed at the toe. Do not wear sandals. If you use a stepladder: Make sure that it is fully opened. Do not climb a closed stepladder. Make sure that both sides of the stepladder are locked into place. Ask someone to hold it for you, if possible. Clearly mark and make sure that you can see: Any grab bars or handrails. First and last steps. Where the edge of each step is. Use tools that help you move around (mobility aids) if they are needed. These include: Canes. Walkers. Scooters. Crutches. Turn on the lights when you go into a dark area. Replace any light bulbs as soon as they burn out. Set up your furniture so you have a clear path. Avoid moving your furniture around. If any of your floors are uneven, fix them. If there are any pets around you, be aware of where they are. Review your medicines with your doctor. Some medicines can make you feel dizzy. This can increase your chance of falling. Ask your doctor what other things that you can do to help prevent falls. This information is not intended to replace advice given to you by your health care provider. Make sure you discuss any questions you have with your health care provider. Document Released: 10/10/2009 Document Revised: 05/21/2016 Document Reviewed: 01/18/2015 Elsevier Interactive Patient Education  2017 [employer].

## 2021-09-08 NOTE — Progress Notes (Signed)
Subjective:   Marissa Wells is a 70 y.o. female who presents for Medicare Annual (Subsequent) preventive examination.  Virtual Visit via Telephone Note  I connected with  Marissa Wells on 09/08/21 at  8:15 AM EDT by telephone and verified that I am speaking with the correct person using two identifiers.  Location: Patient: Home Provider: [facility] Persons participating in the virtual visit: patient/Nurse Health Advisor   I discussed the limitations, risks, security and privacy concerns of performing an evaluation and management service by telephone and the availability of in person appointments. The patient expressed understanding and agreed to proceed.  Interactive audio and video telecommunications were attempted between this nurse and patient, however failed, due to patient having technical difficulties OR patient did not have access to video capability.  We continued and completed visit with audio only.  Some vital signs may be absent or patient reported.   Marissa Gutt E Lavoris Canizales, LPN   Review of Systems     Cardiac Risk Factors include: advanced age (>79men, >89 women);hypertension;dyslipidemia;sedentary lifestyle;obesity (BMI >30kg/m2)     Objective:    Today's Vitals   09/08/21 0815  Weight: 186 lb (84.4 kg)  Height: 5\' 6"  (1.676 m)   Body mass index is 30.02 kg/m.  Advanced Directives 09/08/2021 03/19/2020  Does Patient Have a Medical Advance Directive? No Yes  Type of Advance Directive - Healthcare Power of Attorney  Would patient like information on creating a medical advance directive? Yes (MAU/Ambulatory/Procedural Areas - Information given) -    Current Medications (verified) Outpatient Encounter Medications as of 09/08/2021  Medication Sig   amLODipine (NORVASC) 5 MG tablet Take 1 tablet (5 mg total) by mouth daily.   aspirin 81 MG tablet Take 81 mg by mouth daily.   augmented betamethasone dipropionate (DIPROLENE-AF) 0.05 % cream    Calcium-Magnesium-Vitamin D 600-40-500  MG-MG-UNIT TB24 Take by mouth.   levothyroxine (SYNTHROID) 75 MCG tablet TAKE 1 TABLET DAILY   nystatin-triamcinolone (MYCOLOG II) cream Apply 1 application topically 2 (two) times daily.   rosuvastatin (CRESTOR) 5 MG tablet Take 1 tablet (5 mg total) by mouth daily.   sertraline (ZOLOFT) 100 MG tablet Take 1 tablet (100 mg total) by mouth daily.   zolpidem (AMBIEN) 10 MG tablet Take 1 tablet (10 mg total) by mouth at bedtime as needed for sleep.   No facility-administered encounter medications on file as of 09/08/2021.    Allergies (verified) Compazine [prochlorperazine edisylate] and Prochlorperazine   History: Past Medical History:  Diagnosis Date   Hyperlipidemia    Hypertension    Thyroid disease    Past Surgical History:  Procedure Laterality Date   CHOLECYSTECTOMY     TUBAL LIGATION     Family History  Problem Relation Age of Onset   Breast cancer Sister    Hypertension Sister    Diabetes Father    Hyperlipidemia Father    Hypertension Daughter    Diabetes Son    Hypertension Son    Hypertension Sister    Social History   Socioeconomic History   Marital status: Married    Spouse name: 11/08/2021   Number of children: 2   Years of education: 16   Highest education level: Bachelor's degree (e.g., BA, AB, BS)  Occupational History   Occupation: diability specialist  Tobacco Use   Smoking status: Former   Smokeless tobacco: Never  Marissa Burly Use: Never used  Substance and Sexual Activity   Alcohol use: No   Drug  use: No   Sexual activity: Not on file  Other Topics Concern   Not on file  Social History Narrative   Marissa Wells works as a Scientist, research (life sciences)disability specialist, 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 She lives with her husband Marissa Wells. She has a daughter and son both located in [place]. She has a Emergency planning/management officerpet cat and she enjoys gardening.    Social Determinants of Health   Financial Resource Strain: Low Risk    Difficulty of Paying Living Expenses: Not hard at  all  Food Insecurity: No Food Insecurity   Worried About Programme researcher, broadcasting/film/videounning Out of Food in the Last Year: Never true   Ran Out of Food in the Last Year: Never true  Transportation Needs: No Transportation Needs   Lack of Transportation (Medical): No   Lack of Transportation (Non-Medical): No  Physical Activity: Sufficiently Active   Days of Exercise per Week: 5 days   Minutes of Exercise per Session: 30 min  Stress: No Stress Concern Present   Feeling of Stress : Not at all  Social Connections: Socially Integrated   Frequency of Communication with Friends and Family: More than three times a week   Frequency of Social Gatherings with Friends and Family: Once a week   Attends Religious Services: More than 4 times per year   Active Member of [employer] or Organizations: Yes   Attends Engineer, structuralClub or Organization Meetings: More than 4 times per year   Marital Status: Married    Tobacco Counseling Counseling given: Not Answered   Clinical Intake:  Pre-visit preparation completed: Yes  Pain : No/denies pain     BMI - recorded: 30.02 Nutritional Status: BMI > 30  Obese Nutritional Risks: None Diabetes: No  How often do you need to have someone help you when you read instructions, pamphlets, or other written materials from your doctor or pharmacy?: 1 - Never  Diabetic? no  Interpreter Needed?: No  Information entered by :: Marissa Rugg, LPN   Activities of Daily Living In your present state of health, do you have any difficulty performing the following activities: 09/08/2021  Hearing? N  Vision? N  Difficulty concentrating or making decisions? N  Walking or climbing stairs? N  Dressing or bathing? N  Doing errands, shopping? N  Preparing Food and eating ? N  Using the Toilet? N  In the past six months, have you accidently leaked urine? N  Do you have problems with loss of bowel control? N  Managing your Medications? N  Managing your Finances? N  Housekeeping or managing your Housekeeping?  N  Some recent data might be hidden    Patient Care Team: Marissa Wells, Mary-Margaret, FNP as PCP - General (Nurse Practitioner)  Indicate any recent Medical Services you may have received from other than [facility] providers in the past year (date may be approximate).     Assessment:   This is a routine wellness examination for Marissa Wells.  Hearing/Vision screen Hearing Screening - Comments:: Denies hearing difficulties  Vision Screening - Comments:: Wears eyeglasses - [facility]  Dietary issues and exercise activities discussed: Current Exercise Habits: The patient has a physically strenuous job, but has no regular exercise apart from work., Type of exercise: walking, Time (Minutes): 30, Frequency (Times/Week): 5, Weekly Exercise (Minutes/Week): 150, Intensity: Mild, Exercise limited by: None identified   Goals Addressed             This Visit's Progress    Have 3 meals  a day   On track      Depression Screen PHQ 2/9 Scores 09/08/2021 04/15/2021 03/10/2021 09/05/2020 03/19/2020 02/27/2020 02/06/2019  PHQ - 2 Score 0 0 0 0 1 0 0  PHQ- 9 Score - - 1 1 3  - -    Fall Risk Fall Risk  09/08/2021 04/15/2021 03/10/2021 03/19/2020 02/27/2020  Falls in the past year? - 0 0 0 0  Number falls in past yr: 0 - - - -  Injury with Fall? 0 - - - -  Risk for fall due to : Impaired vision - - - -  Follow up Falls prevention discussed - - - -    FALL RISK PREVENTION PERTAINING TO THE HOME:  Any stairs in or around the home? Yes  If so, are there any without handrails? No  Home free of loose throw rugs in walkways, pet beds, electrical cords, etc? Yes  Adequate lighting in your home to reduce risk of falls? Yes   ASSISTIVE DEVICES UTILIZED TO PREVENT FALLS:  Life alert? No  Use of a cane, walker or w/c? No  Grab bars in the bathroom? No  Shower chair or bench in shower? Yes  Elevated toilet seat or a handicapped toilet? Yes   TIMED UP AND GO:  Was the test  performed? No . Telephonic visit  Cognitive Function: Normal cognitive status assessed by direct observation by this Nurse Health Advisor. No abnormalities found.       6CIT Screen 03/19/2020  What Year? 0 points  What month? 0 points  What time? 0 points  Count back from 20 0 points  Months in reverse 0 points  Repeat phrase 0 points  Total Score 0    Immunizations Immunization History  Administered Date(s) Administered   Zoster Recombinat (Shingrix) 11/29/2020, 03/30/2021    TDAP status: Due, Education has been provided regarding the importance of this vaccine. Advised may receive this vaccine at local pharmacy or Health Dept. Aware to provide a copy of the vaccination record if obtained from local pharmacy or Health Dept. Verbalized acceptance and understanding.  Flu Vaccine status: Due, Education has been provided regarding the importance of this vaccine. Advised may receive this vaccine at local pharmacy or Health Dept. Aware to provide a copy of the vaccination record if obtained from local pharmacy or Health Dept. Verbalized acceptance and understanding.  Pneumococcal vaccine status: Due, Education has been provided regarding the importance of this vaccine. Advised may receive this vaccine at local pharmacy or Health Dept. Aware to provide a copy of the vaccination record if obtained from local pharmacy or Health Dept. Verbalized acceptance and understanding.  Covid-19 vaccine status: Completed vaccines  Qualifies for Shingles Vaccine? Yes   Zostavax completed No   Shingrix Completed?: Yes  Screening Tests Health Maintenance  Topic Date Due   COVID-19 Vaccine (1) Never done   URINE MICROALBUMIN  Never done   PNA vac Low Risk Adult (1 of 2 - PCV13) Never done   MAMMOGRAM  03/29/2020   INFLUENZA VACCINE  Never done   TETANUS/TDAP  03/10/2022 (Originally 01/28/2014)   HEMOGLOBIN A1C  09/10/2021   OPHTHALMOLOGY EXAM  01/16/2022   DEXA SCAN  01/29/2022   Fecal DNA  (Cologuard)  02/15/2022   FOOT EXAM  03/10/2022   Hepatitis C Screening  Completed   Zoster Vaccines- Shingrix  Completed   HPV VACCINES  Aged Out    Health Maintenance  Health Maintenance Due  Topic Date Due   COVID-19 Vaccine (  1) Never done   URINE MICROALBUMIN  Never done   PNA vac Low Risk Adult (1 of 2 - PCV13) Never done   MAMMOGRAM  03/29/2020   INFLUENZA VACCINE  Never done    Colorectal cancer screening: Type of screening: Cologuard. Completed 02/15/2019. Repeat every 3 years  Mammogram status: Completed 05/03/2020. Repeat every year She plans to make appt soon - has these done with Dr Tomblin's office  Bone Density status: Completed 01/29/2017. Results reflect: Bone density results: NORMAL. Repeat every 5 years.  Lung Cancer Screening: (Low Dose CT Chest recommended if Age 51-80 years, 30 pack-year currently smoking OR have quit w/in 15years.) does not qualify.   Additional Screening:  Hepatitis C Screening: does qualify; Completed 05/11/2016  Vision Screening: Recommended annual ophthalmology exams for early detection of glaucoma and other disorders of the eye. Is the patient up to date with their annual eye exam?  Yes  Who is the provider or what is the name of the office in which the patient attends annual eye exams? [facility] If pt is not established with a provider, would they like to be referred to a provider to establish care? No .   Dental Screening: Recommended annual dental exams for proper oral hygiene  Community Resource Referral / Chronic Care Management: CRR required this visit?  No   CCM required this visit?  No      Plan:     I have personally reviewed and noted the following in the patient's chart:   Medical and social history Use of alcohol, tobacco or illicit drugs  Current medications and supplements including opioid prescriptions.  Functional ability and status Nutritional status Physical activity Advanced directives List of other  physicians Hospitalizations, surgeries, and ER visits in previous 12 months Vitals Screenings to include cognitive, depression, and falls Referrals and appointments  In addition, I have reviewed and discussed with patient certain preventive protocols, quality metrics, and best practice recommendations. A written personalized care plan for preventive services as well as general preventive health recommendations were provided to patient.     [place] Constable, LPN   1/61/0960   Nurse Notes: None

## 2021-09-12 ENCOUNTER — Other Ambulatory Visit: Payer: Self-pay

## 2021-09-12 ENCOUNTER — Ambulatory Visit (INDEPENDENT_AMBULATORY_CARE_PROVIDER_SITE_OTHER): Payer: Medicare Other | Admitting: Nurse Practitioner

## 2021-09-12 ENCOUNTER — Encounter: Payer: Self-pay | Admitting: Nurse Practitioner

## 2021-09-12 VITALS — BP 125/78 | HR 70 | Temp 97.8°F | Resp 20 | Ht 66.0 in | Wt 190.0 lb

## 2021-09-12 DIAGNOSIS — I10 Essential (primary) hypertension: Secondary | ICD-10-CM

## 2021-09-12 DIAGNOSIS — E119 Type 2 diabetes mellitus without complications: Secondary | ICD-10-CM | POA: Diagnosis not present

## 2021-09-12 DIAGNOSIS — Z6830 Body mass index (BMI) 30.0-30.9, adult: Secondary | ICD-10-CM | POA: Diagnosis not present

## 2021-09-12 DIAGNOSIS — E782 Mixed hyperlipidemia: Secondary | ICD-10-CM | POA: Diagnosis not present

## 2021-09-12 DIAGNOSIS — E039 Hypothyroidism, unspecified: Secondary | ICD-10-CM | POA: Diagnosis not present

## 2021-09-12 DIAGNOSIS — F5101 Primary insomnia: Secondary | ICD-10-CM | POA: Diagnosis not present

## 2021-09-12 DIAGNOSIS — F3340 Major depressive disorder, recurrent, in remission, unspecified: Secondary | ICD-10-CM

## 2021-09-12 LAB — BAYER DCA HB A1C WAIVED: HB A1C (BAYER DCA - WAIVED): 5.8 % — ABNORMAL HIGH (ref 4.8–5.6)

## 2021-09-12 MED ORDER — AMLODIPINE BESYLATE 5 MG PO TABS: 5.0000 mg | ORAL_TABLET | Freq: Every day | ORAL | 1 refills | Status: DC

## 2021-09-12 MED ORDER — SERTRALINE HCL 100 MG PO TABS: 100.0000 mg | ORAL_TABLET | Freq: Every day | ORAL | 1 refills | Status: DC

## 2021-09-12 MED ORDER — ZOLPIDEM TARTRATE 10 MG PO TABS: 10.0000 mg | ORAL_TABLET | Freq: Every evening | ORAL | 5 refills | Status: DC | PRN

## 2021-09-12 MED ORDER — ROSUVASTATIN CALCIUM 5 MG PO TABS: 5.0000 mg | ORAL_TABLET | Freq: Every day | ORAL | 1 refills | Status: DC

## 2021-09-12 NOTE — Progress Notes (Signed)
Subjective:    Patient ID: Marissa Wells, female    DOB: April 26, 1951, 70 y.o.   MRN: [id number]   Chief Complaint: medical management of chronic issues     HPI:  1. Primary hypertension NO c/o chest pain, Osb or headache. Does not check blood pressure at home. BP Readings from Last 3 Encounters:  04/15/21 131/68  03/10/21 127/79  09/05/20 132/77     2. Mixed hyperlipidemia Does try  to watch diet but does no dedicated exercise. Lab Results  Component Value Date   CHOL 174 03/10/2021   HDL 52 03/10/2021   LDLCALC 105 (H) 03/10/2021   TRIG 91 03/10/2021   CHOLHDL 3.3 03/10/2021     3. Diabetes mellitus without complication ([place]) Does not check blood sugars at home. She is currently on diet control Lab Results  Component Value Date   HGBA1C 5.8 03/10/2021    4. Acquired hypothyroidism No problems that she is aware of. Lab Results  Component Value Date   TSH 2.120 03/10/2021     5. Recurrent major depressive disorder, in remission [facility]) Has been on zoloft for several years. She says it is working well for her. Depression screen [facility] 2/9 09/12/2021 09/08/2021 04/15/2021  Decreased Interest 0 0 0  Down, Depressed, Hopeless 0 0 0  PHQ - 2 Score 0 0 0  Altered sleeping - - -  Tired, decreased energy - - -  Change in appetite - - -  Feeling bad or failure about yourself  - - -  Trouble concentrating - - -  Moving slowly or fidgety/restless - - -  Suicidal thoughts - - -  PHQ-9 Score - - -  Difficult doing work/chores - - -   GAD 7 : Generalized Anxiety Score 09/12/2021  Nervous, Anxious, on Edge 0  Control/stop worrying 0  Worry too much - different things 0  Trouble relaxing 0  Restless 0  Easily annoyed or irritable 0  Afraid - awful might happen 0  Total GAD 7 Score 0  Anxiety Difficulty Not difficult at all      6. Primary insomnia Has to take ambien to sleep at night. Sleeps about 7-8 hours a night  7. BMI 29.0-29.9,adult Weight is up 4lbs Wt  Readings from Last 3 Encounters:  09/12/21 190 lb (86.2 kg)  09/08/21 186 lb (84.4 kg)  04/15/21 187 lb (84.8 kg)   BMI Readings from Last 3 Encounters:  09/12/21 30.67 kg/m  09/08/21 30.02 kg/m  04/15/21 30.18 kg/m      Outpatient Encounter Medications as of 09/12/2021  Medication Sig   amLODipine (NORVASC) 5 MG tablet Take 1 tablet (5 mg total) by mouth daily.   aspirin 81 MG tablet Take 81 mg by mouth daily.   augmented betamethasone dipropionate (DIPROLENE-AF) 0.05 % cream    Calcium-Magnesium-Vitamin D 600-40-500 MG-MG-UNIT TB24 Take by mouth.   levothyroxine (SYNTHROID) 75 MCG tablet TAKE 1 TABLET DAILY   nystatin-triamcinolone (MYCOLOG II) cream Apply 1 application topically 2 (two) times daily.   rosuvastatin (CRESTOR) 5 MG tablet Take 1 tablet (5 mg total) by mouth daily.   sertraline (ZOLOFT) 100 MG tablet Take 1 tablet (100 mg total) by mouth daily.   zolpidem (AMBIEN) 10 MG tablet Take 1 tablet (10 mg total) by mouth at bedtime as needed for sleep.   No facility-administered encounter medications on file as of 09/12/2021.    Past Surgical History:  Procedure Laterality Date   CHOLECYSTECTOMY     TUBAL  LIGATION      Family History  Problem Relation Age of Onset   Breast cancer Sister    Hypertension Sister    Diabetes Father    Hyperlipidemia Father    Hypertension Daughter    Diabetes Son    Hypertension Son    Hypertension Sister     New complaints: None today  Social history: Lives with her husband. 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  Eyes:  Negative for pain.  Respiratory:  Negative for shortness of breath.   Cardiovascular:  Negative for chest pain, palpitations and leg swelling.  Gastrointestinal:  Negative for abdominal pain.  Endocrine: Negative for polydipsia.  Skin:  Negative for rash.  Neurological:  Negative  for dizziness, weakness and headaches.  Hematological:  Does not bruise/bleed easily.  All other systems reviewed and are negative.     Objective:   Physical Exam Vitals and nursing note reviewed.  Constitutional:      General: She is not in acute distress.    Appearance: Normal appearance. She is well-developed.  HENT:     Head: Normocephalic.     Right Ear: Tympanic membrane normal.     Left Ear: Tympanic membrane normal.     Nose: Nose normal.     Mouth/Throat:     Mouth: Mucous membranes are moist.  Eyes:     Pupils: Pupils are equal, round, and reactive to light.  Neck:     Vascular: No carotid bruit or JVD.  Cardiovascular:     Rate and Rhythm: Normal rate and regular rhythm.     Heart sounds: Normal heart sounds.  Pulmonary:     Effort: Pulmonary effort is normal. No respiratory distress.     Breath sounds: Normal breath sounds. No wheezing or rales.  Chest:     Chest wall: No tenderness.  Abdominal:     General: Bowel sounds are normal. There is no distension or abdominal bruit.     Palpations: Abdomen is soft. There is no hepatomegaly, splenomegaly, mass or pulsatile mass.     Tenderness: There is no abdominal tenderness.  Musculoskeletal:        General: Normal range of motion.     Cervical back: Normal range of motion and neck supple.  Lymphadenopathy:     Cervical: No cervical adenopathy.  Skin:    General: Skin is warm and dry.  Neurological:     Mental Status: She is alert and oriented to person, place, and time.     Deep Tendon Reflexes: Reflexes are normal and symmetric.  Psychiatric:        Behavior: Behavior normal.        Thought Content: Thought content normal.        Judgment: Judgment normal.    BP 125/78   Pulse 70   Temp 97.8 F (36.6 C) (Temporal)   Resp 20   Ht _0  (1.676 m)   Wt 190 lb (86.2 kg)   SpO2 98%   BMI 30.67 kg/m   Hgba1c 5.8%.      Assessment & Plan:   Marissa Wells comes in today with chief complaint of Medical  Management of Chronic Issues   Diagnosis and orders addressed:  1. Primary hypertension Low sodium dietl - CBC with Differential/Platelet - CMP14+EGFR - amLODipine (NORVASC) 5 MG tablet; Take 1 tablet (5 mg total) by mouth  daily.  Dispense: 90 tablet; Refill: 1  2. Mixed hyperlipidemia low fat diet - Lipid panel - rosuvastatin (CRESTOR) 5 MG tablet; Take 1 tablet (5 mg total) by mouth daily.  Dispense: 90 tablet; Refill: 1  3. Diabetes mellitus without complication ([place]) Continue to watch carbs in diet - Bayer DCA Hb A1c Waived  4. Acquired hypothyroidism Labs pending - Thyroid Panel With TSH  5. Recurrent major depressive disorder, in remission ([place]) Stress management - sertraline (ZOLOFT) 100 MG tablet; Take 1 tablet (100 mg total) by mouth daily.  Dispense: 90 tablet; Refill: 1  6. Primary insomnia Bedtime routine - zolpidem (AMBIEN) 10 MG tablet; Take 1 tablet (10 mg total) by mouth at bedtime as needed for sleep.  Dispense: 30 tablet; Refill: 5  7. BMI 30.0-30.9,adult Discussed diet and exercise for person with BMI >25 Will recheck weight in 3-6 months     Labs pending Health Maintenance reviewed Diet and exercise encouraged  Follow up plan: 6 months   Mary-Margaret Hassell Done, FNP

## 2021-09-12 NOTE — Patient Instructions (Signed)
Cooking With Less Salt Cooking with less salt is one way to reduce the amount of sodium you get from food. Sodium is one of the elements that make up salt. It is found naturally in foods and is also added to certain foods. Depending on your condition and overall health, your health care provider or dietitian may recommend that you reduce your sodium intake. Most people should have less than 2,300 milligrams (mg) of sodium each day. If you have high blood pressure (hypertension), you may need to limit your sodium to 1,500 mg each day. Follow the tipsbelow to help reduce your sodium intake. What are tips for eating less sodium? Reading food labels  Check the food label before buying or using packaged ingredients. Always check the label for the serving size and sodium content. Look for products with no more than 140 mg of sodium in one serving. Check the % Daily Value column to see what percent of the daily recommended amount of sodium is provided in one serving of the product. Foods with 5% or less in this column are considered low in sodium. Foods with 20% or higher are considered high in sodium. Do not choose foods with salt as one of the first three ingredients on the ingredients list. If salt is one of the first three ingredients, it usually means the item is high in sodium.  Shopping Buy sodium-free or low-sodium products. Look for the following words on food labels: Low-sodium. Sodium-free. Reduced-sodium. No salt added. Unsalted. Always check the sodium content even if foods are labeled as low-sodium or no salt added. Buy fresh foods. Cooking Use herbs, seasonings without salt, and spices as substitutes for salt. Use sodium-free baking soda when baking. Grill, braise, or roast foods to add flavor with less salt. Avoid adding salt to pasta, rice, or hot cereals. Drain and rinse canned vegetables, beans, and meat before use. Avoid adding salt when cooking sweets and desserts. Cook with  low-sodium ingredients. What foods are high in sodium? Vegetables Regular canned vegetables (not low-sodium or reduced-sodium). Sauerkraut, pickled vegetables, and relishes. Olives. French fries. Onion rings. Regular canned tomato sauce and paste. Regular tomato and vegetable juice. Frozenvegetables in sauces. Grains Instant hot cereals. Bread stuffing, pancake, and biscuit mixes. Croutons. Seasoned rice or pasta mixes. Noodle soup cups. Boxed or frozen macaroni and cheese. Regular salted crackers. Self-rising flour. Rolls. Bagels. Flourtortillas and wraps. Meats and other proteins Meat or fish that is salted, canned, smoked, cured, spiced, or pickled. This includes bacon, ham, sausages, hot dogs, corned beef, chipped beef, meat loaves, salt pork, jerky, pickled herring, anchovies, regular canned tuna, andsardines. Salted nuts. Dairy Processed cheese and cheese spreads. Cheese curds. Blue cheese. Feta cheese.String cheese. Regular cottage cheese. Buttermilk. Canned milk. The items listed above may not be a complete list of foods high in sodium. Actual amounts of sodium may be different depending on processing. Contact a dietitian for more information. What foods are low in sodium? Fruits Fresh, frozen, or canned fruit with no sauce added. Fruit juice. Vegetables Fresh or frozen vegetables with no sauce added. "No salt added" canned vegetables. "No salt added" tomato sauce and paste. Low-sodium orreduced-sodium tomato and vegetable juice. Grains Noodles, pasta, quinoa, rice. Shredded or puffed wheat or puffed rice. Regular or quick oats (not instant). Low-sodium crackers. Low-sodium bread. Whole-grainbread and whole-grain pasta. Unsalted popcorn. Meats and other proteins Fresh or frozen whole meats, poultry (not injected with sodium), and fish with no sauce added. Unsalted nuts. Dried peas, beans, and   lentils without added salt. Unsalted canned beans. Eggs. Unsalted nut butters. Low-sodium canned  tunaor chicken. Dairy Milk. Soy milk. Yogurt. Low-sodium cheeses, such as Swiss, Monterey Jack, mozzarella, and ricotta. Sherbet or ice cream (keep to  cup per serving).Cream cheese. Fats and oils Unsalted butter or margarine. Other foods Homemade pudding. Sodium-free baking soda and baking powder. Herbs and spices.Low-sodium seasoning mixes. Beverages Coffee and tea. Carbonated beverages. The items listed above may not be a complete list of foods low in sodium. Actual amounts of sodium may be different depending on processing. Contact a dietitian for more information. What are some salt alternatives when cooking? The following are herbs, seasonings, and spices that can be used instead of salt to flavor your food. Herbs should be fresh or dried. Do not choose packaged mixes. Next to the name of the herb, spice, or seasoning aresome examples of foods you can pair it with. Herbs [place] leaves - Soups, meat and vegetable dishes, and spaghetti sauce. Basil - Italian dishes, soups, pasta, and fish dishes. Cilantro - Meat, poultry, and vegetable dishes. Chili powder - Marinades and Mexican dishes. Chives - Salad dressings and potato dishes. Cumin - Mexican dishes, couscous, and meat dishes. Dill - Fish dishes, sauces, and salads. Fennel - Meat and vegetable dishes, breads, and cookies. Garlic (do not use garlic salt) - Italian dishes, meat dishes, salad dressings, and sauces. Marjoram - Soups, potato dishes, and meat dishes. Oregano - Pizza and spaghetti sauce. Parsley - Salads, soups, pasta, and meat dishes. Rosemary - Italian dishes, salad dressings, soups, and red meats. Saffron - Fish dishes, pasta, and some poultry dishes. Sage - Stuffings and sauces. Tarragon - Fish and poultry dishes. Thyme - Stuffing, meat, and fish dishes. Seasonings Lemon juice - Fish dishes, poultry dishes, vegetables, and salads. Vinegar - Salad dressings, vegetables, and fish dishes. Spices Cinnamon - Sweet  dishes, such as cakes, cookies, and puddings. Cloves - Gingerbread, puddings, and marinades for meats. Curry - Vegetable dishes, fish and poultry dishes, and stir-fry dishes. Ginger - Vegetable dishes, fish dishes, and stir-fry dishes. Nutmeg - Pasta, vegetables, poultry, fish dishes, and custard. Summary Cooking with less salt is one way to reduce the amount of sodium that you get from food. Buy sodium-free or low-sodium products. Check the food label before using or buying packaged ingredients. Use herbs, seasonings without salt, and spices as substitutes for salt in foods. This information is not intended to replace advice given to you by your health care provider. Make sure you discuss any questions you have with your healthcare provider. Document Revised: 12/06/2019 Document Reviewed: 12/06/2019 Elsevier Patient Education  2022 Elsevier Inc.  

## 2021-09-13 LAB — THYROID PANEL WITH TSH
Free Thyroxine Index: 1.9 (ref 1.2–4.9)
T3 Uptake Ratio: 25 % (ref 24–39)
T4, Total: 7.6 ug/dL (ref 4.5–12.0)
TSH: 2.84 u[IU]/mL (ref 0.450–4.500)

## 2021-09-13 LAB — CMP14+EGFR
ALT: 10 IU/L (ref 0–32)
AST: 18 IU/L (ref 0–40)
Albumin/Globulin Ratio: 1.6 (ref 1.2–2.2)
Albumin: 4.3 g/dL (ref 3.8–4.8)
Alkaline Phosphatase: 134 IU/L — ABNORMAL HIGH (ref 44–121)
BUN/Creatinine Ratio: 15 (ref 12–28)
BUN: 14 mg/dL (ref 8–27)
Bilirubin Total: <0.2 mg/dL (ref 0.0–1.2)
CO2: 23 mmol/L (ref 20–29)
Calcium: 9.3 mg/dL (ref 8.7–10.3)
Chloride: 106 mmol/L (ref 96–106)
Creatinine, Ser: 0.95 mg/dL (ref 0.57–1.00)
Globulin, Total: 2.7 g/dL (ref 1.5–4.5)
Glucose: 108 mg/dL — ABNORMAL HIGH (ref 65–99)
Potassium: 4.8 mmol/L (ref 3.5–5.2)
Sodium: 142 mmol/L (ref 134–144)
Total Protein: 7 g/dL (ref 6.0–8.5)
eGFR: 65 mL/min/{1.73_m2} (ref >59)

## 2021-09-13 LAB — LIPID PANEL
Chol/HDL Ratio: 3.7 ratio (ref 0.0–4.4)
Cholesterol, Total: 190 mg/dL (ref 100–199)
HDL: 52 mg/dL (ref >39)
LDL Chol Calc (NIH): 116 mg/dL — ABNORMAL HIGH (ref 0–99)
Triglycerides: 122 mg/dL (ref 0–149)
VLDL Cholesterol Cal: 22 mg/dL (ref 5–40)

## 2021-09-13 LAB — CBC WITH DIFFERENTIAL/PLATELET
Basophils Absolute: 0.1 10*3/uL (ref 0.0–0.2)
Basos: 1 %
EOS (ABSOLUTE): 0.3 10*3/uL (ref 0.0–0.4)
Eos: 4 %
Hematocrit: 40.6 % (ref 34.0–46.6)
Hemoglobin: 13.5 g/dL (ref 11.1–15.9)
Immature Grans (Abs): 0 10*3/uL (ref 0.0–0.1)
Immature Granulocytes: 0 %
Lymphocytes Absolute: 3.5 10*3/uL — ABNORMAL HIGH (ref 0.7–3.1)
Lymphs: 52 %
MCH: 30.2 pg (ref 26.6–33.0)
MCHC: 33.3 g/dL (ref 31.5–35.7)
MCV: 91 fL (ref 79–97)
Monocytes Absolute: 0.5 10*3/uL (ref 0.1–0.9)
Monocytes: 7 %
Neutrophils Absolute: 2.4 10*3/uL (ref 1.4–7.0)
Neutrophils: 36 %
Platelets: 333 10*3/uL (ref 150–450)
RBC: 4.47 x10E6/uL (ref 3.77–5.28)
RDW: 14.8 % (ref 11.7–15.4)
WBC: 6.7 10*3/uL (ref 3.4–10.8)

## 2021-09-26 DIAGNOSIS — Z20822 Contact with and (suspected) exposure to covid-19: Secondary | ICD-10-CM | POA: Diagnosis not present

## 2021-09-27 DIAGNOSIS — Z20822 Contact with and (suspected) exposure to covid-19: Secondary | ICD-10-CM | POA: Diagnosis not present

## 2021-10-28 DIAGNOSIS — Z20822 Contact with and (suspected) exposure to covid-19: Secondary | ICD-10-CM | POA: Diagnosis not present

## 2021-10-29 DIAGNOSIS — N952 Postmenopausal atrophic vaginitis: Secondary | ICD-10-CM | POA: Diagnosis not present

## 2021-10-29 DIAGNOSIS — Z1231 Encounter for screening mammogram for malignant neoplasm of breast: Secondary | ICD-10-CM | POA: Diagnosis not present

## 2021-10-29 DIAGNOSIS — Z01419 Encounter for gynecological examination (general) (routine) without abnormal findings: Secondary | ICD-10-CM | POA: Diagnosis not present

## 2021-10-29 DIAGNOSIS — Z6822 Body mass index (BMI) 22.0-22.9, adult: Secondary | ICD-10-CM | POA: Diagnosis not present

## 2022-03-16 ENCOUNTER — Telehealth: Payer: Self-pay | Admitting: Nurse Practitioner

## 2022-03-16 ENCOUNTER — Encounter: Payer: Self-pay | Admitting: Nurse Practitioner

## 2022-03-16 ENCOUNTER — Ambulatory Visit (INDEPENDENT_AMBULATORY_CARE_PROVIDER_SITE_OTHER): Payer: Medicare Other | Admitting: Nurse Practitioner

## 2022-03-16 VITALS — BP 128/76 | HR 71 | Temp 98.3°F | Resp 20 | Ht 66.0 in | Wt 196.0 lb

## 2022-03-16 DIAGNOSIS — E039 Hypothyroidism, unspecified: Secondary | ICD-10-CM

## 2022-03-16 DIAGNOSIS — I10 Essential (primary) hypertension: Secondary | ICD-10-CM | POA: Diagnosis not present

## 2022-03-16 DIAGNOSIS — F3340 Major depressive disorder, recurrent, in remission, unspecified: Secondary | ICD-10-CM | POA: Diagnosis not present

## 2022-03-16 DIAGNOSIS — E119 Type 2 diabetes mellitus without complications: Secondary | ICD-10-CM | POA: Diagnosis not present

## 2022-03-16 DIAGNOSIS — Z1211 Encounter for screening for malignant neoplasm of colon: Secondary | ICD-10-CM

## 2022-03-16 DIAGNOSIS — F5101 Primary insomnia: Secondary | ICD-10-CM

## 2022-03-16 DIAGNOSIS — Z6829 Body mass index (BMI) 29.0-29.9, adult: Secondary | ICD-10-CM

## 2022-03-16 DIAGNOSIS — Z23 Encounter for immunization: Secondary | ICD-10-CM

## 2022-03-16 DIAGNOSIS — E782 Mixed hyperlipidemia: Secondary | ICD-10-CM

## 2022-03-16 LAB — BAYER DCA HB A1C WAIVED: HB A1C (BAYER DCA - WAIVED): 5.7 % — ABNORMAL HIGH (ref 4.8–5.6)

## 2022-03-16 MED ORDER — ZOLPIDEM TARTRATE 10 MG PO TABS: 10.0000 mg | ORAL_TABLET | Freq: Every evening | ORAL | 5 refills | Status: DC | PRN

## 2022-03-16 MED ORDER — OZEMPIC (0.25 OR 0.5 MG/DOSE) 2 MG/1.5ML ~~LOC~~ SOPN: PEN_INJECTOR | SUBCUTANEOUS | 2 refills | Status: DC

## 2022-03-16 MED ORDER — ROSUVASTATIN CALCIUM 5 MG PO TABS: 5.0000 mg | ORAL_TABLET | Freq: Every day | ORAL | 1 refills | Status: DC

## 2022-03-16 MED ORDER — AMLODIPINE BESYLATE 5 MG PO TABS: 5.0000 mg | ORAL_TABLET | Freq: Every day | ORAL | 1 refills | Status: DC

## 2022-03-16 MED ORDER — SERTRALINE HCL 100 MG PO TABS: 100.0000 mg | ORAL_TABLET | Freq: Every day | ORAL | 1 refills | Status: DC

## 2022-03-16 MED ORDER — LEVOTHYROXINE SODIUM 75 MCG PO TABS: 75.0000 ug | ORAL_TABLET | Freq: Every day | ORAL | 1 refills | Status: DC

## 2022-03-16 NOTE — Telephone Encounter (Signed)
Ozempic prescription sent o pharmacy. Know that may cause slight nausea and heart burn. If want [place] to show you how to inject it just bring  by office and we will show you how to use it. ?

## 2022-03-16 NOTE — Addendum Note (Signed)
Addended by: Cleda Daub on: 03/16/2022 04:03 PM ? ? Modules accepted: Orders ? ?

## 2022-03-16 NOTE — Progress Notes (Signed)
? ?Subjective:  ? ? Patient ID: Marissa Wells, female    DOB: [birth year], 71 y.o.   MRN: [id number] ? ? ?Chief Complaint: medical management of chronic issues  ?  ? ?HPI: ? ?Marissa Wells is a 71 y.o. who identifies as a female who was assigned female at birth.  ? ?Social history: ?Lives with: her husband ?Work history: owns disability office ? ? ?Comes in today for follow up of the following chronic medical issues: ? ?1. Primary hypertension ?No c/o chest pain, sob or headache. Does not check blood pressure at home. ?BP Readings from Last 3 Encounters:  ?09/12/21 125/78  ?04/15/21 131/68  ?03/10/21 127/79  ? ? ? ?2. Mixed hyperlipidemia ?Does not watch diet and does no dedicated exercise. ?Lab Results  ?Component Value Date  ? CHOL 190 09/12/2021  ? HDL 52 09/12/2021  ? LDLCALC 116 (H) 09/12/2021  ? TRIG 122 09/12/2021  ? CHOLHDL 3.7 09/12/2021  ? ?The 10-year ASCVD risk score (Arnett DK, et al., 2019) is: 40.6% ? ? ? ?3. Diabetes mellitus without complication ([place]) ?She does not check blood sugars at home. Denies any low blood sugars. ?Lab Results  ?Component Value Date  ? HGBA1C 5.8 (H) 09/12/2021  ? ? ? ?4. Acquired hypothyroidism ?No problems that she is aware of. ?Lab Results  ?Component Value Date  ? TSH 2.840 09/12/2021  ? ? ? ?5. Recurrent major depressive disorder, in remission ([place]) ?Is on zoloft daily and is doing well. ?Depression screen [facility] 2/9 03/16/2022 09/12/2021 09/12/2021  ?Decreased Interest 0 0 0  ?Down, Depressed, Hopeless 0 0 0  ?PHQ - 2 Score 0 0 0  ?Altered sleeping 0 0 -  ?Tired, decreased energy 0 0 -  ?Change in appetite 0 0 -  ?Feeling bad or failure about yourself  0 0 -  ?Trouble concentrating 0 0 -  ?Moving slowly or fidgety/restless 0 0 -  ?Suicidal thoughts 0 0 -  ?PHQ-9 Score 0 0 -  ?Difficult doing work/chores Not difficult at all Not difficult at all -  ? ? ? ? ?6. Primary insomnia ?Is on [place] daily and is doing well. SLeeps about 7-8 hours a night. ? ?7. BMI 29.0-29.9,adult ?Weight is  up 6lbs ?Wt Readings from Last 3 Encounters:  ?03/16/22 196 lb (88.9 kg)  ?09/12/21 190 lb (86.2 kg)  ?09/08/21 186 lb (84.4 kg)  ? ?BMI Readings from Last 3 Encounters:  ?03/16/22 31.64 kg/m?  ?09/12/21 30.67 kg/m?  ?09/08/21 30.02 kg/m?  ? ? ? ? ?New complaints: ?None today ? ?Allergies  ?Allergen Reactions  ? Compazine [Prochlorperazine Edisylate]   ? Prochlorperazine Other (See Comments)  ? ?Outpatient Encounter Medications as of 03/16/2022  ?Medication Sig  ? amLODipine (NORVASC) 5 MG tablet Take 1 tablet (5 mg total) by mouth daily.  ? aspirin 81 MG tablet Take 81 mg by mouth daily.  ? Calcium-Magnesium-Vitamin D 600-40-500 MG-MG-UNIT TB24 Take by mouth.  ? levothyroxine (SYNTHROID) 75 MCG tablet TAKE 1 TABLET DAILY  ? rosuvastatin (CRESTOR) 5 MG tablet Take 1 tablet (5 mg total) by mouth daily.  ? sertraline (ZOLOFT) 100 MG tablet Take 1 tablet (100 mg total) by mouth daily.  ? zolpidem (AMBIEN) 10 MG tablet Take 1 tablet (10 mg total) by mouth at bedtime as needed for sleep.  ? ?No facility-administered encounter medications on file as of 03/16/2022.  ? ? ?Past Surgical History:  ?Procedure Laterality Date  ? CHOLECYSTECTOMY    ? TUBAL LIGATION    ? ? ?  Family History  ?Problem Relation Age of Onset  ? Breast cancer Sister   ? Hypertension Sister   ? Diabetes Father   ? Hyperlipidemia Father   ? Hypertension Daughter   ? Diabetes Son   ? Hypertension Son   ? Hypertension Sister   ? ? ? ? ?Controlled substance contract: n/a ? ? ? ? ? ?Review of Systems  ?Constitutional:  Negative for diaphoresis.  ?Eyes:  Negative for pain.  ?Respiratory:  Negative for shortness of breath.   ?Cardiovascular:  Negative for chest pain, palpitations and leg swelling.  ?Gastrointestinal:  Negative for abdominal pain.  ?Endocrine: Negative for polydipsia.  ?Skin:  Negative for rash.  ?Neurological:  Negative for dizziness, weakness and headaches.  ?Hematological:  Does not bruise/bleed easily.  ?All other systems reviewed and are  negative. ? ?   ?Objective:  ? Physical Exam ?Vitals and nursing note reviewed.  ?Constitutional:   ?   General: She is not in acute distress. ?   Appearance: Normal appearance. She is well-developed.  ?HENT:  ?   Head: Normocephalic.  ?   Right Ear: Tympanic membrane normal.  ?   Left Ear: Tympanic membrane normal.  ?   Nose: Nose normal.  ?   Mouth/Throat:  ?   Mouth: Mucous membranes are moist.  ?Eyes:  ?   Pupils: Pupils are equal, round, and reactive to light.  ?Neck:  ?   Vascular: No carotid bruit or JVD.  ?Cardiovascular:  ?   Rate and Rhythm: Normal rate and regular rhythm.  ?   Heart sounds: Normal heart sounds.  ?Pulmonary:  ?   Effort: Pulmonary effort is normal. No respiratory distress.  ?   Breath sounds: Normal breath sounds. No wheezing or rales.  ?Chest:  ?   Chest wall: No tenderness.  ?Abdominal:  ?   General: Bowel sounds are normal. There is no distension or abdominal bruit.  ?   Palpations: Abdomen is soft. There is no hepatomegaly, splenomegaly, mass or pulsatile mass.  ?   Tenderness: There is no abdominal tenderness.  ?Musculoskeletal:     ?   General: Normal range of motion.  ?   Cervical back: Normal range of motion and neck supple.  ?Lymphadenopathy:  ?   Cervical: No cervical adenopathy.  ?Skin: ?   General: Skin is warm and dry.  ?Neurological:  ?   Mental Status: She is alert and oriented to person, place, and time.  ?   Deep Tendon Reflexes: Reflexes are normal and symmetric.  ?Psychiatric:     ?   Behavior: Behavior normal.     ?   Thought Content: Thought content normal.     ?   Judgment: Judgment normal.  ? ? ?BP 128/76   Pulse 71   Temp 98.3 ?F (36.8 ?C) (Temporal)   Resp 20   Ht '5\' 6"'  (1.676 m)   Wt 196 lb (88.9 kg)   SpO2 98%   BMI 31.64 kg/m?  ? ?Hgbac1 5.7% ? ?   ?Assessment & Plan:  ?Marissa Wells comes in today with chief complaint of Medical Management of Chronic Issues ? ? ?Diagnosis and orders addressed: ? ?1. Primary hypertension ?Low sodium diet ?- CBC with  Differential/Platelet ?- CMP14+EGFR ?- amLODipine (NORVASC) 5 MG tablet; Take 1 tablet (5 mg total) by mouth daily.  Dispense: 90 tablet; Refill: 1 ? ?2. Mixed hyperlipidemia ?Low fat diet ?- Lipid panel ?- rosuvastatin (CRESTOR) 5 MG tablet; Take 1 tablet (  5 mg total) by mouth daily.  Dispense: 90 tablet; Refill: 1 ? ?3. Diabetes mellitus without complication ([place]) ?Continue to watch carbs in diet ?- Bayer DCA Hb A1c Waived ?- Microalbumin / creatinine urine ratio ? ?4. Acquired hypothyroidism ?Labs pending ?- levothyroxine (SYNTHROID) 75 MCG tablet; Take 1 tablet (75 mcg total) by mouth daily.  Dispense: 90 tablet; Refill: 1 ? ?5. Recurrent major depressive disorder, in remission ([place]) ?Stress management ?- sertraline (ZOLOFT) 100 MG tablet; Take 1 tablet (100 mg total) by mouth daily.  Dispense: 90 tablet; Refill: 1 ? ?6. Primary insomnia ?Bedtime routine ?- zolpidem (AMBIEN) 10 MG tablet; Take 1 tablet (10 mg total) by mouth at bedtime as needed for sleep.  Dispense: 30 tablet; Refill: 5 ? ?7. BMI 29.0-29.9,adult ?Discussed diet and exercise for person with BMI >25 ?Will recheck weight in 3-6 months ? ? ? ?Labs pending ?Health Maintenance reviewed ?Diet and exercise encouraged ? ?Follow up plan: ?3 months ? ? ?Mary-Margaret Hassell Done, FNP ? ? ?

## 2022-03-16 NOTE — Telephone Encounter (Signed)
Please review and advise.

## 2022-03-17 ENCOUNTER — Telehealth: Payer: Self-pay | Admitting: Nurse Practitioner

## 2022-03-17 LAB — LIPID PANEL
Chol/HDL Ratio: 3.9 ratio (ref 0.0–4.4)
Cholesterol, Total: 207 mg/dL — ABNORMAL HIGH (ref 100–199)
HDL: 53 mg/dL (ref >39)
LDL Chol Calc (NIH): 133 mg/dL — ABNORMAL HIGH (ref 0–99)
Triglycerides: 120 mg/dL (ref 0–149)
VLDL Cholesterol Cal: 21 mg/dL (ref 5–40)

## 2022-03-17 LAB — MICROALBUMIN / CREATININE URINE RATIO
Creatinine, Urine: 121.2 mg/dL
Microalb/Creat Ratio: 7 mg/g creat (ref 0–29)
Microalbumin, Urine: 8.7 ug/mL

## 2022-03-17 LAB — CMP14+EGFR
ALT: 9 IU/L (ref 0–32)
AST: 10 IU/L (ref 0–40)
Albumin/Globulin Ratio: 1.6 (ref 1.2–2.2)
Albumin: 4.4 g/dL (ref 3.8–4.8)
Alkaline Phosphatase: 146 IU/L — ABNORMAL HIGH (ref 44–121)
BUN/Creatinine Ratio: 15 (ref 12–28)
BUN: 12 mg/dL (ref 8–27)
Bilirubin Total: <0.2 mg/dL (ref 0.0–1.2)
CO2: 23 mmol/L (ref 20–29)
Calcium: 9.5 mg/dL (ref 8.7–10.3)
Chloride: 105 mmol/L (ref 96–106)
Creatinine, Ser: 0.81 mg/dL (ref 0.57–1.00)
Globulin, Total: 2.7 g/dL (ref 1.5–4.5)
Glucose: 75 mg/dL (ref 70–99)
Potassium: 4.2 mmol/L (ref 3.5–5.2)
Sodium: 149 mmol/L — ABNORMAL HIGH (ref 134–144)
Total Protein: 7.1 g/dL (ref 6.0–8.5)
eGFR: 79 mL/min/{1.73_m2} (ref >59)

## 2022-03-17 LAB — CBC WITH DIFFERENTIAL/PLATELET
Basophils Absolute: 0.1 10*3/uL (ref 0.0–0.2)
Basos: 1 %
EOS (ABSOLUTE): 0.2 10*3/uL (ref 0.0–0.4)
Eos: 3 %
Hematocrit: 40.8 % (ref 34.0–46.6)
Hemoglobin: 13.7 g/dL (ref 11.1–15.9)
Immature Grans (Abs): 0 10*3/uL (ref 0.0–0.1)
Immature Granulocytes: 0 %
Lymphocytes Absolute: 3.7 10*3/uL — ABNORMAL HIGH (ref 0.7–3.1)
Lymphs: 51 %
MCH: 30.6 pg (ref 26.6–33.0)
MCHC: 33.6 g/dL (ref 31.5–35.7)
MCV: 91 fL (ref 79–97)
Monocytes Absolute: 0.6 10*3/uL (ref 0.1–0.9)
Monocytes: 8 %
Neutrophils Absolute: 2.7 10*3/uL (ref 1.4–7.0)
Neutrophils: 37 %
Platelets: 347 10*3/uL (ref 150–450)
RBC: 4.48 x10E6/uL (ref 3.77–5.28)
RDW: 13.7 % (ref 11.7–15.4)
WBC: 7.3 10*3/uL (ref 3.4–10.8)

## 2022-03-17 MED ORDER — TIRZEPATIDE 2.5 MG/0.5ML ~~LOC~~ SOAJ: 2.5000 mg | SUBCUTANEOUS | 0 refills | Status: DC

## 2022-03-17 NOTE — Telephone Encounter (Signed)
Left detailed message on patients voicemail that rx sent to pharmacy 

## 2022-03-17 NOTE — Telephone Encounter (Signed)
Changed from ozempic to mounjario ?

## 2022-03-17 NOTE — Telephone Encounter (Signed)
Patient aware.

## 2022-03-23 DIAGNOSIS — Z1211 Encounter for screening for malignant neoplasm of colon: Secondary | ICD-10-CM | POA: Diagnosis not present

## 2022-03-30 LAB — COLOGUARD: COLOGUARD: NEGATIVE

## 2022-04-08 ENCOUNTER — Telehealth: Payer: Self-pay | Admitting: Nurse Practitioner

## 2022-04-08 NOTE — Telephone Encounter (Signed)
Patient aware will wait for mmm to advise  ?

## 2022-04-08 NOTE — Telephone Encounter (Signed)
Patient's progress note states change from Ozempic to [facility].  I am not quite sure exactly what the goal of treatment here is.  Patient can follow-up with PCP on Monday.  She may need to be reevaluated and reassessed for the reasons for medication change, and therapeutic effect.  Before changing doses or refill. ?

## 2022-04-13 MED ORDER — TIRZEPATIDE 5 MG/0.5ML ~~LOC~~ SOAJ: 5.0000 mg | SUBCUTANEOUS | 1 refills | Status: DC

## 2022-04-15 ENCOUNTER — Telehealth: Payer: Self-pay | Admitting: Nurse Practitioner

## 2022-04-15 NOTE — Telephone Encounter (Signed)
Patient completed her tirzepatide [facility]) 5 MG/0.5ML Pen pack and wanted to know if anymore would be called in. Please call back  ?

## 2022-04-15 NOTE — Telephone Encounter (Signed)
Med sent to pharmacy on 4/17. Patient notified and verbalized understanding ?

## 2022-05-12 ENCOUNTER — Other Ambulatory Visit: Payer: Self-pay | Admitting: Nurse Practitioner

## 2022-05-12 MED ORDER — TIRZEPATIDE 5 MG/0.5ML ~~LOC~~ SOAJ: 5.0000 mg | SUBCUTANEOUS | 1 refills | Status: DC

## 2022-05-12 NOTE — Telephone Encounter (Signed)
?  Prescription Request ? ?05/12/2022 ? ?Is this a "Controlled Substance" medicine? NO ? ?Have you seen your PCP in the last 2 weeks? NO ? ?If YES, route message to pool  -  If NO, patient needs to be scheduled for appointment. ? ?What is the name of the medication or equipment? Tirzepatide 5 mg/0.5 ML Pen ? ?Have you contacted your pharmacy to request a refill? NO  ? ?Which pharmacy would you like this sent to? [facility] ? ? ?Patient notified that their request is being sent to the clinical staff for review and that they should receive a response within 2 business days.  ?  ?

## 2022-05-12 NOTE — Telephone Encounter (Signed)
Pt last seen 3/20 with Marissa Wells. Pt has a chronic follow up 6/26. Last Rx sent on 4/17 #20ml with 1 RF. ? ?Ok for refill? ?

## 2022-06-22 ENCOUNTER — Encounter: Payer: Self-pay | Admitting: Nurse Practitioner

## 2022-06-22 ENCOUNTER — Ambulatory Visit (INDEPENDENT_AMBULATORY_CARE_PROVIDER_SITE_OTHER): Payer: Medicare Other | Admitting: Nurse Practitioner

## 2022-06-22 VITALS — BP 121/69 | HR 71 | Temp 97.9°F | Resp 20 | Ht 66.0 in | Wt 173.0 lb

## 2022-06-22 DIAGNOSIS — I10 Essential (primary) hypertension: Secondary | ICD-10-CM | POA: Diagnosis not present

## 2022-06-22 DIAGNOSIS — F5101 Primary insomnia: Secondary | ICD-10-CM

## 2022-06-22 DIAGNOSIS — E782 Mixed hyperlipidemia: Secondary | ICD-10-CM

## 2022-06-22 DIAGNOSIS — F3340 Major depressive disorder, recurrent, in remission, unspecified: Secondary | ICD-10-CM | POA: Diagnosis not present

## 2022-06-22 DIAGNOSIS — Z6829 Body mass index (BMI) 29.0-29.9, adult: Secondary | ICD-10-CM

## 2022-06-22 DIAGNOSIS — E119 Type 2 diabetes mellitus without complications: Secondary | ICD-10-CM | POA: Diagnosis not present

## 2022-06-22 DIAGNOSIS — Z87891 Personal history of nicotine dependence: Secondary | ICD-10-CM

## 2022-06-22 DIAGNOSIS — E039 Hypothyroidism, unspecified: Secondary | ICD-10-CM

## 2022-06-22 LAB — BAYER DCA HB A1C WAIVED: HB A1C (BAYER DCA - WAIVED): 5.4 % (ref 4.8–5.6)

## 2022-06-22 MED ORDER — TIRZEPATIDE 7.5 MG/0.5ML ~~LOC~~ SOAJ: 7.5000 mg | SUBCUTANEOUS | 3 refills | Status: DC

## 2022-06-22 MED ORDER — ZOLPIDEM TARTRATE 10 MG PO TABS: 10.0000 mg | ORAL_TABLET | Freq: Every evening | ORAL | 5 refills | Status: DC | PRN

## 2022-06-22 MED ORDER — AMLODIPINE BESYLATE 5 MG PO TABS: 5.0000 mg | ORAL_TABLET | Freq: Every day | ORAL | 1 refills | Status: DC

## 2022-06-22 MED ORDER — ROSUVASTATIN CALCIUM 5 MG PO TABS: 5.0000 mg | ORAL_TABLET | Freq: Every day | ORAL | 1 refills | Status: DC

## 2022-06-22 MED ORDER — SERTRALINE HCL 100 MG PO TABS: 100.0000 mg | ORAL_TABLET | Freq: Every day | ORAL | 1 refills | Status: DC

## 2022-06-22 MED ORDER — LEVOTHYROXINE SODIUM 75 MCG PO TABS: 75.0000 ug | ORAL_TABLET | Freq: Every day | ORAL | 1 refills | Status: DC

## 2022-06-22 NOTE — Progress Notes (Signed)
Subjective:    Patient ID: Marissa Wells, female    DOB: [birth year], 71 y.o.   MRN: [id number]  Chief Complaint: medical management of chronic issues     HPI:  Marissa Wells is a 71 y.o. who identifies as a female who was assigned female at birth.   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 Primary hypertension No c/o chest pain, sob or headache. Does not check blood pressure at home.  BP Readings from Last 3 Encounters:  03/16/22 128/76  09/12/21 125/78  04/15/21 131/68     2. Mixed hyperlipidemia Does try to watch diet but does no dedicated exercise. Lab Results  Component Value Date   CHOL 207 (H) 03/16/2022   HDL 53 03/16/2022   LDLCALC 133 (H) 03/16/2022   TRIG 120 03/16/2022   CHOLHDL 3.9 03/16/2022     3. Diabetes mellitus without complication [facility]) Patient does not check blood sugars at home. Lab Results  Component Value Date   HGBA1C 5.7 (H) 03/16/2022     4. Acquired hypothyroidism No problems  that aware of. Lab Results  Component Value Date   TSH 2.840 09/12/2021      5. Recurrent major depressive disorder, in remission (HCC) Is on zoloft daily and is doing well.    06/22/2022    2:17 PM 03/16/2022    2:20 PM 09/12/2021    2:43 PM  Depression screen PHQ 2/9  Decreased Interest 0 0 0  Down, Depressed, Hopeless 0 0 0  PHQ - 2 Score 0 0 0  Altered sleeping 0 0 0  Tired, decreased energy 0 0 0  Change in appetite 0 0 0  Feeling bad or failure about yourself  0 0 0  Trouble concentrating 0 0 0  Moving slowly or fidgety/restless 0 0 0  Suicidal thoughts 0 0 0  PHQ-9 Score 0 0 0  Difficult doing work/chores Not difficult at all Not difficult at all Not difficult at all     6. Primary insomnia Is on ambien occasionally and sleeps welll with it. Sleeps about 8 hours a night  7. Former  smoker Has not smoked in over a year  8. BMI 29.0-29.9,adult Weight is down 23lbs Wt Readings from Last 3 Encounters:  06/22/22 173 lb (78.5 kg)  03/16/22 196 lb (88.9 kg)  09/12/21 190 lb (86.2 kg)   BMI Readings from Last 3 Encounters:  06/22/22 27.92 kg/m  03/16/22 31.64 kg/m  09/12/21 30.67 kg/m       New complaints: none  Allergies  Allergen Reactions   Compazine [Prochlorperazine Edisylate]    Prochlorperazine Other (See Comments)   Outpatient Encounter Medications as of 06/22/2022  Medication Sig   amLODipine (NORVASC) 5 MG tablet Take 1 tablet (5 mg total) by mouth daily.   aspirin 81 MG tablet Take 81 mg by mouth daily.   Calcium-Magnesium-Vitamin D 600-40-500 MG-MG-UNIT TB24 Take by mouth.   levothyroxine (SYNTHROID) 75 MCG tablet Take 1 tablet (75 mcg total) by mouth daily.   rosuvastatin (CRESTOR) 5 MG tablet Take 1 tablet (5 mg total) by mouth daily.   sertraline (ZOLOFT) 100 MG tablet Take 1 tablet (100 mg total) by mouth daily.   tirzepatide [facility]) 5 MG/0.5ML Pen Inject 5 mg into the skin once a week.   zolpidem (AMBIEN) 10 MG tablet  Take 1 tablet (10 mg total) by mouth at bedtime as needed for sleep.   No facility-administered encounter medications on file as of 06/22/2022.    Past Surgical History:  Procedure Laterality Date   CHOLECYSTECTOMY     TUBAL LIGATION      Family History  Problem Relation Age of Onset   Breast cancer Sister    Hypertension Sister    Diabetes Father    Hyperlipidemia Father    Hypertension Daughter    Diabetes Son    Hypertension Son    Hypertension Sister       Controlled substance contract: n/a     Review of Systems  Constitutional:  Negative for diaphoresis.  Eyes:  Negative for pain.  Respiratory:  Negative for shortness of breath.   Cardiovascular:  Negative for chest pain, palpitations and leg swelling.  Gastrointestinal:  Negative for abdominal pain.  Endocrine: Negative for polydipsia.   Skin:  Negative for rash.  Neurological:  Negative for dizziness, weakness and headaches.  Hematological:  Does not bruise/bleed easily.  All other systems reviewed and are negative.      Objective:   Physical Exam Vitals and nursing note reviewed.  Constitutional:      General: She is not in acute distress.    Appearance: Normal appearance. She is well-developed.  HENT:     Head: Normocephalic.     Right Ear: Tympanic membrane normal.     Left Ear: Tympanic membrane normal.     Nose: Nose normal.     Mouth/Throat:     Mouth: Mucous membranes are moist.  Eyes:     Pupils: Pupils are equal, round, and reactive to light.  Neck:     Vascular: No carotid bruit or JVD.  Cardiovascular:     Rate and Rhythm: Normal rate and regular rhythm.     Heart sounds: Normal heart sounds.  Pulmonary:     Effort: Pulmonary effort is normal. No respiratory distress.     Breath sounds: Normal breath sounds. No wheezing or rales.  Chest:     Chest wall: No tenderness.  Abdominal:     General: Bowel sounds are normal. There is no distension or abdominal bruit.     Palpations: Abdomen is soft. There is no hepatomegaly, splenomegaly, mass or pulsatile mass.     Tenderness: There is no abdominal tenderness.  Musculoskeletal:        General: Normal range of motion.     Cervical back: Normal range of motion and neck supple.  Lymphadenopathy:     Cervical: No cervical adenopathy.  Skin:    General: Skin is warm and dry.  Neurological:     Mental Status: She is alert and oriented to person, place, and time.     Deep Tendon Reflexes: Reflexes are normal and symmetric.  Psychiatric:        Behavior: Behavior normal.        Thought Content: Thought content normal.        Judgment: Judgment normal.    BP 121/69   Pulse 71   Temp 97.9 F (36.6 C) (Temporal)   Resp 20   Ht 5\' 6"  (1.676 m)   Wt 173 lb (78.5 kg)   SpO2 99%   BMI 27.92 kg/m   HGBA1c 5.8%      Assessment & Plan:  Marissa Wells comes in today with chief complaint of Medical Management of Chronic Issues   Diagnosis and orders addressed:  1. Primary  hypertension Low sodium diet - CBC with Differential/Platelet - CMP14+EGFR - amLODipine (NORVASC) 5 MG tablet; Take 1 tablet (5 mg total) by mouth daily.  Dispense: 90 tablet; Refill: 1  2. Mixed hyperlipidemia Low fat diet - Lipid panel - rosuvastatin (CRESTOR) 5 MG tablet; Take 1 tablet (5 mg total) by mouth daily.  Dispense: 90 tablet; Refill: 1  3. Diabetes mellitus without complication (HCC) Continue to wathc carbs in diet - Bayer DCA Hb A1c Waived - Bayer DCA Hb A1c Waived - tirzepatide (MOUNJARO) 7.5 MG/0.5ML Pen; Inject 7.5 mg into the skin once a week.  Dispense: 6 mL; Refill: 3  4. Acquired hypothyroidism [place] pending - Thyroid Panel With TSH - levothyroxine (SYNTHROID) 75 MCG tablet; Take 1 tablet (75 mcg total) by mouth daily.  Dispense: 90 tablet; Refill: 1  5. Recurrent major depressive disorder, in remission (HCC) Stress management - sertraline (ZOLOFT) 100 MG tablet; Take 1 tablet (100 mg total) by mouth daily.  Dispense: 90 tablet; Refill: 1  6. Primary insomnia Bedtime routine - zolpidem (AMBIEN) 10 MG tablet; Take 1 tablet (10 mg total) by mouth at bedtime as needed for sleep.  Dispense: 30 tablet; Refill: 5  7. Former smoker  8. BMI 29.0-29.9,adult Discussed diet and exercise for person with BMI >25 Will recheck weight in 3-6 months    Labs pending Health Maintenance reviewed Diet and exercise encouraged  Follow up plan: 6 months   Mary-Margaret Daphine Deutscher, FNP

## 2022-07-09 LAB — LIPID PANEL
Chol/HDL Ratio: 3.3 ratio (ref 0.0–4.4)
Cholesterol, Total: 160 mg/dL (ref 100–199)
HDL: 48 mg/dL (ref >39)
LDL Chol Calc (NIH): 96 mg/dL (ref 0–99)
Triglycerides: 82 mg/dL (ref 0–149)
VLDL Cholesterol Cal: 16 mg/dL (ref 5–40)

## 2022-07-09 LAB — CMP14+EGFR
Albumin/Globulin Ratio: 1.4 (ref 1.2–2.2)
Albumin: 4.3 g/dL (ref 3.8–4.8)
Alkaline Phosphatase: 125 IU/L — ABNORMAL HIGH (ref 44–121)
BUN/Creatinine Ratio: 13 (ref 12–28)
BUN: 11 mg/dL (ref 8–27)
Bilirubin Total: <0.2 mg/dL (ref 0.0–1.2)
CO2: 18 mmol/L — ABNORMAL LOW (ref 20–29)
Calcium: 9.4 mg/dL (ref 8.7–10.3)
Chloride: 104 mmol/L (ref 96–106)
Creatinine, Ser: 0.85 mg/dL (ref 0.57–1.00)
Globulin, Total: 3 g/dL (ref 1.5–4.5)
Glucose: 87 mg/dL (ref 70–99)
Potassium: 4.9 mmol/L (ref 3.5–5.2)
Sodium: 139 mmol/L (ref 134–144)
Total Protein: 7.3 g/dL (ref 6.0–8.5)
eGFR: 75 mL/min/{1.73_m2} (ref >59)

## 2022-07-09 LAB — THYROID PANEL WITH TSH
Free Thyroxine Index: 2.5 (ref 1.2–4.9)
T3 Uptake Ratio: 28 % (ref 24–39)
T4, Total: 8.9 ug/dL (ref 4.5–12.0)
TSH: 3.38 u[IU]/mL (ref 0.450–4.500)

## 2022-07-09 LAB — CBC WITH DIFFERENTIAL/PLATELET
Basophils Absolute: 0.1 10*3/uL (ref 0.0–0.2)
Basos: 1 %
EOS (ABSOLUTE): 0.3 10*3/uL (ref 0.0–0.4)
Eos: 3 %
Hematocrit: 40.1 % (ref 34.0–46.6)
Hemoglobin: 13.4 g/dL (ref 11.1–15.9)
Immature Grans (Abs): 0 10*3/uL (ref 0.0–0.1)
Immature Granulocytes: 0 %
Lymphocytes Absolute: 3.1 10*3/uL (ref 0.7–3.1)
Lymphs: 41 %
MCH: 30.2 pg (ref 26.6–33.0)
MCHC: 33.4 g/dL (ref 31.5–35.7)
MCV: 91 fL (ref 79–97)
Monocytes Absolute: 0.4 10*3/uL (ref 0.1–0.9)
Monocytes: 5 %
Neutrophils Absolute: 3.8 10*3/uL (ref 1.4–7.0)
Neutrophils: 50 %
Platelets: 381 10*3/uL (ref 150–450)
RBC: 4.43 x10E6/uL (ref 3.77–5.28)
RDW: 14.7 % (ref 11.7–15.4)
WBC: 7.7 10*3/uL (ref 3.4–10.8)

## 2022-09-09 ENCOUNTER — Ambulatory Visit (INDEPENDENT_AMBULATORY_CARE_PROVIDER_SITE_OTHER): Payer: Medicare Other

## 2022-09-09 VITALS — Ht 66.0 in | Wt 173.0 lb

## 2022-09-09 DIAGNOSIS — Z0000 Encounter for general adult medical examination without abnormal findings: Secondary | ICD-10-CM

## 2022-09-09 NOTE — Patient Instructions (Signed)
Marissa Wells , Thank you for taking time to come for your Medicare Wellness Visit. I appreciate your ongoing commitment to your health goals. Please review the following plan we discussed and let me know if I can assist you in the future.   Screening recommendations/referrals: Colonoscopy: Cologuard 03/30/22 Mammogram: will schedule Bone Density: will schedule w/ mammogram Recommended yearly ophthalmology/optometry visit for glaucoma screening and checkup Recommended yearly dental visit for hygiene and checkup   Influenza vaccine: n/d Pneumococcal vaccine: n/d Tdap vaccine: 03/16/22 Shingles vaccine: Shingrix 11/29/20, 03/30/21   Covid-19:n/d  Advanced directives: no  Conditions/risks identified: none  Next appointment: Follow up in one year for your annual wellness visit 09/13/23 @ 9 am by phone   Preventive Care 65 Years and Older, Female Preventive care refers to lifestyle choices and visits with your health care provider that can promote health and wellness. What does preventive care include? A yearly physical exam. This is also called an annual well check. Dental exams once or twice a year. Routine eye exams. Ask your health care provider how often you should have your eyes checked. Personal lifestyle choices, including: Daily care of your teeth and gums. Regular physical activity. Eating a healthy diet. Avoiding tobacco and drug use. Limiting alcohol use. Practicing safe sex. Taking low-dose aspirin every day. Taking vitamin and mineral supplements as recommended by your health care provider. What happens during an annual well check? The services and screenings done by your health care provider during your annual well check will depend on your age, overall health, lifestyle risk factors, and family history of disease. Counseling  Your health care provider may ask you questions about your: Alcohol use. Tobacco use. Drug use. Emotional well-being. Home and relationship  well-being. Sexual activity. Eating habits. History of falls. Memory and ability to understand (cognition). Work and work Astronomer. Reproductive health. Screening  You may have the following tests or measurements: Height, weight, and BMI. Blood pressure. Lipid and cholesterol levels. These may be checked every 5 years, or more frequently if you are over 3 years old. Skin check. Lung cancer screening. You may have this screening every year starting at age 27 if you have a 30-pack-year history of smoking and currently smoke or have quit within the past 15 years. Fecal occult blood test (FOBT) of the stool. You may have this test every year starting at age 23. Flexible sigmoidoscopy or colonoscopy. You may have a sigmoidoscopy every 5 years or a colonoscopy every 10 years starting at age 61. Hepatitis C blood test. Hepatitis B blood test. Sexually transmitted disease (STD) testing. Diabetes screening. This is done by checking your blood sugar (glucose) after you have not eaten for a while (fasting). You may have this done every 1-3 years. Bone density scan. This is done to screen for osteoporosis. You may have this done starting at age 30. Mammogram. This may be done every 1-2 years. Talk to your health care provider about how often you should have regular mammograms. Talk with your health care provider about your test results, treatment options, and if necessary, the need for more tests. Vaccines  Your health care provider may recommend certain vaccines, such as: Influenza vaccine. This is recommended every year. Tetanus, diphtheria, and acellular pertussis (Tdap, Td) vaccine. You may need a Td booster every 10 years. Zoster vaccine. You may need this after age 67. Pneumococcal 13-valent conjugate (PCV13) vaccine. One dose is recommended after age 49. Pneumococcal polysaccharide (PPSV23) vaccine. One dose is recommended after age 21.  Talk to your health care provider about which  screenings and vaccines you need and how often you need them. This information is not intended to replace advice given to you by your health care provider. Make sure you discuss any questions you have with your health care provider. Document Released: 01/10/2016 Document Revised: 09/02/2016 Document Reviewed: 10/15/2015 Elsevier Interactive Patient Education  2017 [place] Prevention in the Home Falls can cause injuries. They can happen to people of all ages. There are many things you can do to make your home safe and to help prevent falls. What can I do on the outside of my home? Regularly fix the edges of walkways and driveways and fix any cracks. Remove anything that might make you trip as you walk through a door, such as a raised step or threshold. Trim any bushes or trees on the path to your home. Use bright outdoor lighting. Clear any walking paths of anything that might make someone trip, such as rocks or tools. Regularly check to see if handrails are loose or broken. Make sure that both sides of any steps have handrails. Any raised decks and porches should have guardrails on the edges. Have any leaves, snow, or ice cleared regularly. Use sand or salt on walking paths during winter. Clean up any spills in your garage right away. This includes oil or grease spills. What can I do in the bathroom? Use night lights. Install grab bars by the toilet and in the tub and shower. Do not use towel bars as grab bars. Use non-skid mats or decals in the tub or shower. If you need to sit down in the shower, use a plastic, non-slip stool. Keep the floor dry. Clean up any water that spills on the floor as soon as it happens. Remove soap buildup in the tub or shower regularly. Attach bath mats securely with double-sided non-slip rug tape. Do not have throw rugs and other things on the floor that can make you trip. What can I do in the bedroom? Use night lights. Make sure that you have a  light by your bed that is easy to reach. Do not use any sheets or blankets that are too big for your bed. They should not hang down onto the floor. Have a firm chair that has side arms. You can use this for support while you get dressed. Do not have throw rugs and other things on the floor that can make you trip. What can I do in the kitchen? Clean up any spills right away. Avoid walking on wet floors. Keep items that you use a lot in easy-to-reach places. If you need to reach something above you, use a strong step stool that has a grab bar. Keep electrical cords out of the way. Do not use floor polish or wax that makes floors slippery. If you must use wax, use non-skid floor wax. Do not have throw rugs and other things on the floor that can make you trip. What can I do with my stairs? Do not leave any items on the stairs. Make sure that there are handrails on both sides of the stairs and use them. Fix handrails that are broken or loose. Make sure that handrails are as long as the stairways. Check any carpeting to make sure that it is firmly attached to the stairs. Fix any carpet that is loose or worn. Avoid having throw rugs at the top or bottom of the stairs. If you do have throw  rugs, attach them to the floor with carpet tape. Make sure that you have a light switch at the top of the stairs and the bottom of the stairs. If you do not have them, ask someone to add them for you. What else can I do to help prevent falls? Wear shoes that: Do not have high heels. Have rubber bottoms. Are comfortable and fit you well. Are closed at the toe. Do not wear sandals. If you use a stepladder: Make sure that it is fully opened. Do not climb a closed stepladder. Make sure that both sides of the stepladder are locked into place. Ask someone to hold it for you, if possible. Clearly mark and make sure that you can see: Any grab bars or handrails. First and last steps. Where the edge of each step  is. Use tools that help you move around (mobility aids) if they are needed. These include: Canes. Walkers. Scooters. Crutches. Turn on the lights when you go into a dark area. Replace any light bulbs as soon as they burn out. Set up your furniture so you have a clear path. Avoid moving your furniture around. If any of your floors are uneven, fix them. If there are any pets around you, be aware of where they are. Review your medicines with your doctor. Some medicines can make you feel dizzy. This can increase your chance of falling. Ask your doctor what other things that you can do to help prevent falls. This information is not intended to replace advice given to you by your health care provider. Make sure you discuss any questions you have with your health care provider. Document Released: 10/10/2009 Document Revised: 05/21/2016 Document Reviewed: 01/18/2015 Elsevier Interactive Patient Education  2017 [employer]. D

## 2022-09-09 NOTE — Progress Notes (Signed)
Virtual Visit via Telephone Note  I connected with  Marissa Wells on 09/09/22 at  8:45 AM EDT by telephone and verified that I am speaking with the correct person using two identifiers.  Location: Patient: home Provider: [facility] Persons participating in the virtual visit: patient/Nurse Health Advisor   I discussed the limitations, risks, security and privacy concerns of performing an evaluation and management service by telephone and the availability of in person appointments. The patient expressed understanding and agreed to proceed.  Interactive audio and video telecommunications were attempted between this nurse and patient, however failed, due to patient having technical difficulties OR patient did not have access to video capability.  We continued and completed visit with audio only.  Some vital signs may be absent or patient reported.   Hal Hope, LPN  Subjective:   Marissa Wells is a 71 y.o. female who presents for Medicare Annual (Subsequent) preventive examination.  Review of Systems     Cardiac Risk Factors include: advanced age (>22men, >88 women);dyslipidemia;hypertension;diabetes mellitus     Objective:    There were no vitals filed for this visit. There is no height or weight on file to calculate BMI.     09/09/2022    8:47 AM 09/08/2021    8:35 AM 03/19/2020    2:38 PM  Advanced Directives  Does Patient Have a Medical Advance Directive? No No Yes  Type of Advance Directive   Healthcare Power of Attorney  Would patient like information on creating a medical advance directive? No - Patient declined Yes (MAU/Ambulatory/Procedural Areas - Information given)     Current Medications (verified) Outpatient Encounter Medications as of 09/09/2022  Medication Sig   amLODipine (NORVASC) 5 MG tablet Take 1 tablet (5 mg total) by mouth daily.   aspirin 81 MG tablet Take 81 mg by mouth daily.   Calcium 200 MG TABS Calcium   Calcium-Magnesium-Vitamin D 600-40-500 MG-MG-UNIT  TB24 Take by mouth.   levothyroxine (SYNTHROID) 75 MCG tablet Take 1 tablet (75 mcg total) by mouth daily.   Multiple Vitamin (MULTIVITAMIN ADULT PO) Multivitamin   rosuvastatin (CRESTOR) 5 MG tablet Take 1 tablet (5 mg total) by mouth daily.   sertraline (ZOLOFT) 100 MG tablet Take 1 tablet (100 mg total) by mouth daily.   tirzepatide (MOUNJARO) 7.5 MG/0.5ML Pen Inject 7.5 mg into the skin once a week.   zolpidem (AMBIEN) 10 MG tablet Take 1 tablet (10 mg total) by mouth at bedtime as needed for sleep.   [DISCONTINUED] amLODipine (NORVASC) 5 MG tablet Take 1 tablet by mouth daily.   [DISCONTINUED] levothyroxine (SYNTHROID) 75 MCG tablet Take 1 tablet by mouth daily.   [DISCONTINUED] sertraline (ZOLOFT) 100 MG tablet Take 1 tablet by mouth daily.   No facility-administered encounter medications on file as of 09/09/2022.    Allergies (verified) Compazine [prochlorperazine edisylate] and Prochlorperazine   History: Past Medical History:  Diagnosis Date   Hyperlipidemia    Hypertension    Thyroid disease    Past Surgical History:  Procedure Laterality Date   CHOLECYSTECTOMY     TUBAL LIGATION     Family History  Problem Relation Age of Onset   Breast cancer Sister    Hypertension Sister    Diabetes Father    Hyperlipidemia Father    Hypertension Daughter    Diabetes Son    Hypertension Son    Hypertension Sister    Social History   Socioeconomic History   Marital status: Married  Spouse name: Orpah Greek   Number of children: 2   Years of education: 16   Highest education level: Bachelor's degree (e.g., BA, AB, BS)  Occupational History   Occupation: diability specialist  Tobacco Use   Smoking status: Former   Smokeless tobacco: Never  Scientific laboratory technician Use: Never used  Substance and Sexual Activity   Alcohol use: No   Drug use: No   Sexual activity: Not on file  Other Topics Concern   Not on file  Social History Narrative   Mattison works as a Banker, 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 She lives with her husband Orpah Greek. She has a daughter and son both located in [place]. She has a International aid/development worker and she enjoys gardening.    Social Determinants of Health   Financial Resource Strain: Low Risk  (09/09/2022)   Overall Financial Resource Strain (CARDIA)    Difficulty of Paying Living Expenses: Not hard at all  Food Insecurity: No Food Insecurity (09/09/2022)   Hunger Vital Sign    Worried About Running Out of Food in the Last Year: Never true    Ran Out of Food in the Last Year: Never true  Transportation Needs: No Transportation Needs (09/09/2022)   PRAPARE - Hydrologist (Medical): No    Lack of Transportation (Non-Medical): No  Physical Activity: Sufficiently Active (09/09/2022)   Exercise Vital Sign    Days of Exercise per Week: 5 days    Minutes of Exercise per Session: 30 min  Stress: No Stress Concern Present (09/09/2022)   [place]    Feeling of Stress : Not at all  Social Connections: Moderately Integrated (09/09/2022)   Social Connection and Isolation Panel [NHANES]    Frequency of Communication with Friends and Family: More than three times a week    Frequency of Social Gatherings with Friends and Family: Once a week    Attends Religious Services: More than 4 times per year    Active Member of [employer] or Organizations: No    Attends Music therapist: Never    Marital Status: Married    Tobacco Counseling Counseling given: Not Answered   Clinical Intake:  Pre-visit preparation completed: Yes  Pain : No/denies pain     Nutritional Risks: None Diabetes: Yes CBG done?: No Did pt. bring in CBG monitor from home?: No  How often do you need to have someone help you when you read instructions, pamphlets, or other written materials from your doctor or pharmacy?: 1 - Never  Diabetic?yes Nutrition Risk  Assessment:  Has the patient had any N/V/D within the last 2 months?  No  Does the patient have any non-healing wounds?  No  Has the patient had any unintentional weight loss or weight gain?  No   Diabetes:  Is the patient diabetic?  Yes  If diabetic, was a CBG obtained today?  No  Did the patient bring in their glucometer from home?  No  How often do you monitor your CBG's? never.   Financial Strains and Diabetes Management:  Are you having any financial strains with the device, your supplies or your medication? No .  Does the patient want to be seen by Chronic Care Management for management of their diabetes?  No  Would the patient like to be referred to a Nutritionist or for Diabetic Management?  No   Diabetic Exams:  Diabetic Eye  Exam: Completed 01/16/21. Overdue for diabetic eye exam. Pt has been advised about the importance in completing this exam.   Diabetic Foot Exam: Completed 06/22/22. Pt has been advised about the importance in completing this exam.   Interpreter Needed?: No  Information entered by :: Kirke Shaggy, LPN   Activities of Daily Living    09/09/2022    8:48 AM  In your present state of health, do you have any difficulty performing the following activities:  Hearing? 0  Vision? 0  Difficulty concentrating or making decisions? 0  Walking or climbing stairs? 0  Dressing or bathing? 0  Doing errands, shopping? 0  Preparing Food and eating ? N  Using the Toilet? N  In the past six months, have you accidently leaked urine? N  Do you have problems with loss of bowel control? N  Managing your Medications? N  Managing your Finances? N  Housekeeping or managing your Housekeeping? N    Patient Care Team: Chevis Pretty, FNP as PCP - General (Nurse Practitioner) Everlene Farrier, MD as Consulting Physician (Obstetrics and Gynecology)  Indicate any recent Medical Services you may have received from other than [facility] providers in the past year (date may  be approximate).     Assessment:   This is a routine wellness examination for Marissa Wells.  Hearing/Vision screen Hearing Screening - Comments:: No aids Vision Screening - Comments:: Wears glasses- [facility] [place] issues and exercise activities discussed: Current Exercise Habits: Home exercise routine, Type of exercise: walking, Time (Minutes): 30, Frequency (Times/Week): 5, Weekly Exercise (Minutes/Week): 150, Intensity: Mild   Goals Addressed             This Visit's Progress    DIET - EAT MORE FRUITS AND VEGETABLES         Depression Screen    09/09/2022    8:45 AM 06/22/2022    2:17 PM 03/16/2022    2:20 PM 09/12/2021    2:43 PM 09/12/2021    2:30 PM 09/08/2021    8:20 AM 04/15/2021   10:29 AM  PHQ 2/9 Scores  PHQ - 2 Score 0 0 0 0 0 0 0  PHQ- 9 Score 0 0 0 0       Fall Risk    09/09/2022    8:48 AM 06/22/2022    2:17 PM 03/16/2022    2:20 PM 09/12/2021    2:43 PM 09/08/2021    8:35 AM  Fall Risk   Falls in the past year? 0 0 0 0   Number falls in past yr: 0    0  Injury with Fall? 0    0  Risk for fall due to : No Fall Risks    Impaired vision  Follow up Falls evaluation completed    Falls prevention discussed    FALL RISK PREVENTION PERTAINING TO THE HOME:  Any stairs in or around the home? Yes  If so, are there any without handrails? No  Home free of loose throw rugs in walkways, pet beds, electrical cords, etc? Yes  Adequate lighting in your home to reduce risk of falls? Yes   ASSISTIVE DEVICES UTILIZED TO PREVENT FALLS:  Life alert? No  Use of a cane, walker or w/c? No  Grab bars in the bathroom? Yes  Shower chair or bench in shower? Yes  Elevated toilet seat or a handicapped toilet? Yes    Cognitive Function:        09/09/2022  8:50 AM 03/19/2020    2:39 PM  6CIT Screen  What Year? 0 points 0 points  What month? 0 points 0 points  What time? 0 points 0 points  Count back from 20 0 points 0 points  Months in reverse 0 points 0  points  Repeat phrase 0 points 0 points  Total Score 0 points 0 points    Immunizations Immunization History  Administered Date(s) Administered   Tdap 03/16/2022   Zoster Recombinat (Shingrix) 11/29/2020, 03/30/2021    TDAP status: Up to date  Flu Vaccine status: Declined, Education has been provided regarding the importance of this vaccine but patient still declined. Advised may receive this vaccine at local pharmacy or Health Dept. Aware to provide a copy of the vaccination record if obtained from local pharmacy or Health Dept. Verbalized acceptance and understanding.  Pneumococcal vaccine status: Declined,  Education has been provided regarding the importance of this vaccine but patient still declined. Advised may receive this vaccine at local pharmacy or Health Dept. Aware to provide a copy of the vaccination record if obtained from local pharmacy or Health Dept. Verbalized acceptance and understanding.   Covid-19 vaccine status: Declined, Education has been provided regarding the importance of this vaccine but patient still declined. Advised may receive this vaccine at local pharmacy or Health Dept.or vaccine clinic. Aware to provide a copy of the vaccination record if obtained from local pharmacy or Health Dept. Verbalized acceptance and understanding.  Qualifies for Shingles Vaccine? Yes   Zostavax completed No   Shingrix Completed?: Yes  Screening Tests Health Maintenance  Topic Date Due   COVID-19 Vaccine (1) Never done   OPHTHALMOLOGY EXAM  01/16/2022   INFLUENZA VACCINE  Never done   Pneumonia Vaccine 50+ Years old (1 - PCV) 03/17/2023 (Originally 07/10/2018)   MAMMOGRAM  03/17/2023 (Originally 03/30/2019)   DEXA SCAN  06/23/2023 (Originally 01/29/2022)   HEMOGLOBIN A1C  12/22/2022   URINE MICROALBUMIN  03/17/2023   FOOT EXAM  06/23/2023   Fecal DNA (Cologuard)  03/23/2025   TETANUS/TDAP  03/16/2032   Hepatitis C Screening  Completed   Zoster Vaccines- Shingrix  Completed    HPV VACCINES  Aged Out    Health Maintenance  Health Maintenance Due  Topic Date Due   COVID-19 Vaccine (1) Never done   OPHTHALMOLOGY EXAM  01/16/2022   INFLUENZA VACCINE  Never done    Colorectal cancer screening: Type of screening: Cologuard. Completed 03/30/22. Repeat every 3 years  Mammogram status: Completed 03/13/19. Repeat every year- will schedule at [facility]   Bone Density status: Completed 01/29/17. Results reflect: Bone density results: OSTEOPENIA. Repeat every 5 years.- will schedule along w/ mammogram  Lung Cancer Screening: (Low Dose CT Chest recommended if Age 67-80 years, 30 pack-year currently smoking OR have quit w/in 15years.) does not qualify.    Additional Screening:  Hepatitis C Screening: does qualify; Completed 05/11/16  Vision Screening: Recommended annual ophthalmology exams for early detection of glaucoma and other disorders of the eye. Is the patient up to date with their annual eye exam?  Yes  Who is the provider or what is the name of the office in which the patient attends annual eye exams? [facility] in [place] If pt is not established with a provider, would they like to be referred to a provider to establish care? No .   Dental Screening: Recommended annual dental exams for proper oral hygiene  Community Resource Referral / Chronic Care Management: CRR required this  visit?  No   CCM required this visit?  No      Plan:     I have personally reviewed and noted the following in the patient's chart:   Medical and social history Use of alcohol, tobacco or illicit drugs  Current medications and supplements including opioid prescriptions. Patient is not currently taking opioid prescriptions. Functional ability and status Nutritional status Physical activity Advanced directives List of other physicians Hospitalizations, surgeries, and ER visits in previous 12 months Vitals Screenings to include cognitive, depression, and  falls Referrals and appointments  In addition, I have reviewed and discussed with patient certain preventive protocols, quality metrics, and best practice recommendations. A written personalized care plan for preventive services as well as general preventive health recommendations were provided to patient.     Dionisio David, LPN   [id number]   Nurse Notes: none

## 2022-09-30 ENCOUNTER — Telehealth: Payer: Self-pay | Admitting: Nurse Practitioner

## 2022-09-30 MED ORDER — TIRZEPATIDE 5 MG/0.5ML ~~LOC~~ SOAJ: 5.0000 mg | SUBCUTANEOUS | 1 refills | Status: DC

## 2022-09-30 NOTE — Telephone Encounter (Signed)
For refill next month, pt wants to decrease to tirzepatide [facility]) 7.5 MG/0.5ML Pen--5.0. use [employer]. Pt aware MMM is off till 10/12/2022 and may address while is off.

## 2022-09-30 NOTE — Telephone Encounter (Signed)
Please let patient know that mounjario decreased to 5mg  weekly. New prescription sent to pharmacy. . Meds ordered this encounter  Medications   tirzepatide (MOUNJARO) 5 MG/0.5ML Pen    Sig: Inject 5 mg into the skin once a week.    Dispense:  6 mL    Refill:  1    Order Specific Question:   Supervising Provider    Answer:   Chase Picket [[id number]]   [place], FNP

## 2022-12-31 ENCOUNTER — Encounter: Payer: Self-pay | Admitting: Nurse Practitioner

## 2022-12-31 ENCOUNTER — Ambulatory Visit (INDEPENDENT_AMBULATORY_CARE_PROVIDER_SITE_OTHER): Payer: Medicare Other | Admitting: Nurse Practitioner

## 2022-12-31 VITALS — BP 143/84 | HR 75 | Temp 97.7°F | Resp 20 | Ht 66.0 in

## 2022-12-31 DIAGNOSIS — F3340 Major depressive disorder, recurrent, in remission, unspecified: Secondary | ICD-10-CM | POA: Diagnosis not present

## 2022-12-31 DIAGNOSIS — F5101 Primary insomnia: Secondary | ICD-10-CM

## 2022-12-31 DIAGNOSIS — I10 Essential (primary) hypertension: Secondary | ICD-10-CM | POA: Diagnosis not present

## 2022-12-31 DIAGNOSIS — E119 Type 2 diabetes mellitus without complications: Secondary | ICD-10-CM

## 2022-12-31 DIAGNOSIS — Z87891 Personal history of nicotine dependence: Secondary | ICD-10-CM

## 2022-12-31 DIAGNOSIS — E039 Hypothyroidism, unspecified: Secondary | ICD-10-CM

## 2022-12-31 DIAGNOSIS — E782 Mixed hyperlipidemia: Secondary | ICD-10-CM

## 2022-12-31 DIAGNOSIS — Z6829 Body mass index (BMI) 29.0-29.9, adult: Secondary | ICD-10-CM

## 2022-12-31 LAB — BAYER DCA HB A1C WAIVED: HB A1C (BAYER DCA - WAIVED): 5.8 % — ABNORMAL HIGH (ref 4.8–5.6)

## 2022-12-31 MED ORDER — AMLODIPINE BESYLATE 5 MG PO TABS: 5.0000 mg | ORAL_TABLET | Freq: Every day | ORAL | 1 refills | Status: DC

## 2022-12-31 MED ORDER — ZOLPIDEM TARTRATE 10 MG PO TABS: 10.0000 mg | ORAL_TABLET | Freq: Every evening | ORAL | 5 refills | Status: DC | PRN

## 2022-12-31 MED ORDER — ROSUVASTATIN CALCIUM 5 MG PO TABS: 5.0000 mg | ORAL_TABLET | Freq: Every day | ORAL | 1 refills | Status: DC

## 2022-12-31 MED ORDER — SERTRALINE HCL 100 MG PO TABS: 100.0000 mg | ORAL_TABLET | Freq: Every day | ORAL | 1 refills | Status: DC

## 2022-12-31 MED ORDER — LEVOTHYROXINE SODIUM 75 MCG PO TABS: 75.0000 ug | ORAL_TABLET | Freq: Every day | ORAL | 1 refills | Status: DC

## 2022-12-31 MED ORDER — TIRZEPATIDE 7.5 MG/0.5ML ~~LOC~~ SOAJ: 7.5000 mg | SUBCUTANEOUS | 3 refills | Status: DC

## 2022-12-31 NOTE — Patient Instructions (Signed)
Exercising to Stay Healthy To become healthy and stay healthy, it is recommended that you do moderate-intensity and vigorous-intensity exercise. You can tell that you are exercising at a moderate intensity if your heart starts beating faster and you start breathing faster but can still hold a conversation. You can tell that you are exercising at a vigorous intensity if you are breathing much harder and faster and cannot hold a conversation while exercising. How can exercise benefit me? Exercising regularly is important. It has many health benefits, such as: Improving overall fitness, flexibility, and endurance. Increasing bone density. Helping with weight control. Decreasing body fat. Increasing muscle strength and endurance. Reducing stress and tension, anxiety, depression, or anger. Improving overall health. What guidelines should I follow while exercising? Before you start a new exercise program, talk with your health care provider. Do not exercise so much that you hurt yourself, feel dizzy, or get very short of breath. Wear comfortable clothes and wear shoes with good support. Drink plenty of water while you exercise to prevent dehydration or heat stroke. Work out until your breathing and your heartbeat get faster (moderate intensity). How often should I exercise? Choose an activity that you enjoy, and set realistic goals. Your health care provider can help you make an activity plan that is individually designed and works best for you. Exercise regularly as told by your health care provider. This may include: Doing strength training two times a week, such as: Lifting weights. Using resistance bands. Push-ups. Sit-ups. Yoga. Doing a certain intensity of exercise for a given amount of time. Choose from these options: A total of 150 minutes of moderate-intensity exercise every week. A total of 75 minutes of vigorous-intensity exercise every week. A mix of moderate-intensity and  vigorous-intensity exercise every week. Children, pregnant women, people who have not exercised regularly, people who are overweight, and older adults may need to talk with a health care provider about what activities are safe to perform. If you have a medical condition, be sure to talk with your health care provider before you start a new exercise program. What are some exercise ideas? Moderate-intensity exercise ideas include: Walking 1 mile (1.6 km) in about 15 minutes. Biking. Hiking. Golfing. Dancing. Water aerobics. Vigorous-intensity exercise ideas include: Walking 4.5 miles (7.2 km) or more in about 1 hour. Jogging or running 5 miles (8 km) in about 1 hour. Biking 10 miles (16.1 km) or more in about 1 hour. Lap swimming. Roller-skating or in-line skating. Cross-country skiing. Vigorous competitive sports, such as football, basketball, and soccer. Jumping rope. Aerobic dancing. What are some everyday activities that can help me get exercise? Yard work, such as: Pushing a lawn mower. Raking and bagging leaves. Washing your car. Pushing a stroller. Shoveling snow. Gardening. Washing windows or floors. How can I be more active in my day-to-day activities? Use stairs instead of an elevator. Take a walk during your lunch break. If you drive, park your car farther away from your work or school. If you take public transportation, get off one stop early and walk the rest of the way. Stand up or walk around during all of your indoor phone calls. Get up, stretch, and walk around every 30 minutes throughout the day. Enjoy exercise with a friend. Support to continue exercising will help you keep a regular routine of activity. Where to find more information You can find more information about exercising to stay healthy from: U.S. Department of Health and Human Services: www.hhs.gov Centers for Disease Control and Prevention (  CDC): www.cdc.gov Summary Exercising regularly is  important. It will improve your overall fitness, flexibility, and endurance. Regular exercise will also improve your overall health. It can help you control your weight, reduce stress, and improve your bone density. Do not exercise so much that you hurt yourself, feel dizzy, or get very short of breath. Before you start a new exercise program, talk with your health care provider. This information is not intended to replace advice given to you by your health care provider. Make sure you discuss any questions you have with your health care provider. Document Revised: 04/11/2021 Document Reviewed: 04/11/2021 Elsevier Patient Education  2023 Elsevier Inc.  

## 2022-12-31 NOTE — Progress Notes (Signed)
Subjective:    Patient ID: Marissa Wells, female    DOB: May 09, 1951, 72 y.o.   MRN: [id number]   Chief Complaint: medical management of chronic issues     HPI:  Marissa Wells is a 72 y.o. who identifies as a female who was assigned female at birth.   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 Primary hypertension No c/o chest pain, sob or headache. Doe snot check blood pressure at home. BP Readings from Last 3 Encounters:  06/22/22 121/69  03/16/22 128/76  09/12/21 125/78     2. Mixed hyperlipidemia Does not really wathc diet and does no dedicated exercise. Lab Results  Component Value Date   CHOL 160 06/22/2022   HDL 48 06/22/2022   LDLCALC 96 06/22/2022   TRIG 82 06/22/2022   CHOLHDL 3.3 06/22/2022   The 10-year ASCVD risk score (Arnett DK, et al., 2019) is: 25.4%   3. Diabetes mellitus without complication ([place]) She doe sot check her blood sugars at home. Lab Results  Component Value Date   HGBA1C 5.4 06/22/2022     4. Acquired hypothyroidism No issues that aware of. Lab Results  Component Value Date   TSH 3.380 06/22/2022     5. Recurrent major depressive disorder, in remission [facility]) Has been on zoloft for several years. She says she is doing well.    12/31/2022    2:03 PM 09/09/2022    8:45 AM 06/22/2022    2:17 PM  Depression screen PHQ 2/9  Decreased Interest 0 0 0  Down, Depressed, Hopeless 0 0 0  PHQ - 2 Score 0 0 0  Altered sleeping 0 0 0  Tired, decreased energy 0 0 0  Change in appetite 0 0 0  Feeling bad or failure about yourself  0 0 0  Trouble concentrating 0 0 0  Moving slowly or fidgety/restless 0 0 0  Suicidal thoughts 0 0 0  PHQ-9 Score 0 0 0  Difficult doing work/chores Not difficult at all Not difficult at all Not difficult at all     6. Primary insomnia Is on ambien nightly and is sleeping  well.  46. Former smoker Has not smoked in over a year  37. BMI 29.0-29.9,adult No weight changes Wt Readings from Last 3 Encounters:  09/09/22 173 lb (78.5 kg)  06/22/22 173 lb (78.5 kg)  03/16/22 196 lb (88.9 kg)   BMI Readings from Last 3 Encounters:  12/31/22 27.92 kg/m  09/09/22 27.92 kg/m  06/22/22 27.92 kg/m     New complaints: None today  Allergies  Allergen Reactions   Compazine [Prochlorperazine Edisylate]    Prochlorperazine Other (See Comments)   Outpatient Encounter Medications as of 12/31/2022  Medication Sig   amLODipine (NORVASC) 5 MG tablet Take 1 tablet (5 mg total) by mouth daily.   aspirin 81 MG tablet Take 81 mg by mouth daily.   Calcium 200 MG TABS Calcium   Calcium-Magnesium-Vitamin D 600-40-500 MG-MG-UNIT TB24 Take by mouth.   levothyroxine (SYNTHROID) 75 MCG tablet Take 1 tablet (75 mcg total) by mouth daily.   Multiple Vitamin (MULTIVITAMIN ADULT PO) Multivitamin   rosuvastatin (CRESTOR) 5 MG tablet Take 1 tablet (5 mg total) by mouth daily.   sertraline (ZOLOFT) 100 MG tablet Take 1 tablet (100 mg total) by mouth daily.   tirzepatide [facility]) 5 MG/0.5ML Pen Inject 5  mg into the skin once a week.   zolpidem (AMBIEN) 10 MG tablet Take 1 tablet (10 mg total) by mouth at bedtime as needed for sleep.   No facility-administered encounter medications on file as of 12/31/2022.    Past Surgical History:  Procedure Laterality Date   CHOLECYSTECTOMY     TUBAL LIGATION      Family History  Problem Relation Age of Onset   Breast cancer Sister    Hypertension Sister    Diabetes Father    Hyperlipidemia Father    Hypertension Daughter    Diabetes Son    Hypertension Son    Hypertension Sister       Controlled substance contract: n/a     Review of Systems  Constitutional:  Negative for diaphoresis.  Eyes:  Negative for pain.  Respiratory:  Negative for shortness of breath.   Cardiovascular:  Negative for chest pain, palpitations and  leg swelling.  Gastrointestinal:  Negative for abdominal pain.  Endocrine: Negative for polydipsia.  Skin:  Negative for rash.  Neurological:  Negative for dizziness, weakness and headaches.  Hematological:  Does not bruise/bleed easily.  All other systems reviewed and are negative.      Objective:   Physical Exam Vitals and nursing note reviewed.  Constitutional:      General: She is not in acute distress.    Appearance: Normal appearance. She is well-developed.  HENT:     Head: Normocephalic.     Right Ear: Tympanic membrane normal.     Left Ear: Tympanic membrane normal.     Nose: Nose normal.     Mouth/Throat:     Mouth: Mucous membranes are moist.  Eyes:     Pupils: Pupils are equal, round, and reactive to light.  Neck:     Vascular: No carotid bruit or JVD.  Cardiovascular:     Rate and Rhythm: Normal rate and regular rhythm.     Heart sounds: Normal heart sounds.  Pulmonary:     Effort: Pulmonary effort is normal. No respiratory distress.     Breath sounds: Normal breath sounds. No wheezing or rales.  Chest:     Chest wall: No tenderness.  Abdominal:     General: Bowel sounds are normal. There is no distension or abdominal bruit.     Palpations: Abdomen is soft. There is no hepatomegaly, splenomegaly, mass or pulsatile mass.     Tenderness: There is no abdominal tenderness.  Musculoskeletal:        General: Normal range of motion.     Cervical back: Normal range of motion and neck supple.  Lymphadenopathy:     Cervical: No cervical adenopathy.  Skin:    General: Skin is warm and dry.  Neurological:     Mental Status: She is alert and oriented to person, place, and time.     Deep Tendon Reflexes: Reflexes are normal and symmetric.  Psychiatric:        Behavior: Behavior normal.        Thought Content: Thought content normal.        Judgment: Judgment normal.    BP (!) 143/84   Pulse 75   Temp 97.7 F (36.5 C) (Temporal)   Resp 20   Ht _0  (1.676 m)    SpO2 98%   BMI 27.92 kg/m   Hgba1c 5.8%      Assessment & Plan:   Marissa Wells comes in today with chief complaint of Medical Management of Chronic Issues  Diagnosis and orders addressed:  1. Primary hypertension Low sodium diet  - CBC with Differential/Platelet - CMP14+EGFR - amLODipine (NORVASC) 5 MG tablet; Take 1 tablet (5 mg total) by mouth daily.  Dispense: 90 tablet; Refill: 1  2. Mixed hyperlipidemia Low fat diet - Lipid panel - rosuvastatin (CRESTOR) 5 MG tablet; Take 1 tablet (5 mg total) by mouth daily.  Dispense: 90 tablet; Refill: 1  3. Diabetes mellitus without complication ([place]) Continue to watch carbs in diet - Bayer DCA Hb A1c Waived  4. Acquired hypothyroidism Labs pending - levothyroxine (SYNTHROID) 75 MCG tablet; Take 1 tablet (75 mcg total) by mouth daily.  Dispense: 90 tablet; Refill: 1  5. Recurrent major depressive disorder, in remission ([place]) Stress management - ToxASSURE Select 13 (MW), Urine - sertraline (ZOLOFT) 100 MG tablet; Take 1 tablet (100 mg total) by mouth daily.  Dispense: 90 tablet; Refill: 1  6. Primary insomnia Bedtime routine - zolpidem (AMBIEN) 10 MG tablet; Take 1 tablet (10 mg total) by mouth at bedtime as needed for sleep.  Dispense: 30 tablet; Refill: 5  7. Former smoker Do not start smoking again  8. BMI 29.0-29.9,adult Discussed diet and exercise for person with BMI >25 Will recheck weight in 3-6 months' - tirzepatide [facility]) 7.5 MG/0.5ML Pen; Inject 7.5 mg into the skin once a week.  Dispense: 2 mL; Refill: 3   Labs pending Health Maintenance reviewed Diet and exercise encouraged  Follow up plan: 6 months   Mary-Margaret Hassell Done, FNP

## 2023-01-01 LAB — CBC WITH DIFFERENTIAL/PLATELET
Basophils Absolute: 0.1 10*3/uL (ref 0.0–0.2)
Basos: 1 %
EOS (ABSOLUTE): 0.2 10*3/uL (ref 0.0–0.4)
Eos: 3 %
Hematocrit: 38.7 % (ref 34.0–46.6)
Hemoglobin: 12.9 g/dL (ref 11.1–15.9)
Immature Grans (Abs): 0 10*3/uL (ref 0.0–0.1)
Immature Granulocytes: 0 %
Lymphocytes Absolute: 2.9 10*3/uL (ref 0.7–3.1)
Lymphs: 53 %
MCH: 30.8 pg (ref 26.6–33.0)
MCHC: 33.3 g/dL (ref 31.5–35.7)
MCV: 92 fL (ref 79–97)
Monocytes Absolute: 0.3 10*3/uL (ref 0.1–0.9)
Monocytes: 5 %
Neutrophils Absolute: 2.1 10*3/uL (ref 1.4–7.0)
Neutrophils: 38 %
Platelets: 326 10*3/uL (ref 150–450)
RBC: 4.19 x10E6/uL (ref 3.77–5.28)
RDW: 13.6 % (ref 11.7–15.4)
WBC: 5.6 10*3/uL (ref 3.4–10.8)

## 2023-01-01 LAB — CMP14+EGFR
ALT: 8 IU/L (ref 0–32)
AST: 12 IU/L (ref 0–40)
Albumin/Globulin Ratio: 1.5 (ref 1.2–2.2)
Albumin: 4.3 g/dL (ref 3.9–4.9)
Alkaline Phosphatase: 119 IU/L (ref 44–121)
BUN/Creatinine Ratio: 16 (ref 12–28)
BUN: 15 mg/dL (ref 8–27)
Bilirubin Total: <0.2 mg/dL (ref 0.0–1.2)
CO2: 22 mmol/L (ref 20–29)
Calcium: 9 mg/dL (ref 8.7–10.3)
Chloride: 105 mmol/L (ref 96–106)
Creatinine, Ser: 0.93 mg/dL (ref 0.57–1.00)
Globulin, Total: 2.8 g/dL (ref 1.5–4.5)
Glucose: 87 mg/dL (ref 70–99)
Potassium: 5 mmol/L (ref 3.5–5.2)
Sodium: 141 mmol/L (ref 134–144)
Total Protein: 7.1 g/dL (ref 6.0–8.5)
eGFR: 67 mL/min/{1.73_m2} (ref >59)

## 2023-01-01 LAB — LIPID PANEL
Chol/HDL Ratio: 3.8 ratio (ref 0.0–4.4)
Cholesterol, Total: 199 mg/dL (ref 100–199)
HDL: 53 mg/dL (ref >39)
LDL Chol Calc (NIH): 130 mg/dL — ABNORMAL HIGH (ref 0–99)
Triglycerides: 88 mg/dL (ref 0–149)
VLDL Cholesterol Cal: 16 mg/dL (ref 5–40)

## 2023-01-01 MED ORDER — ROSUVASTATIN CALCIUM 20 MG PO TABS: 20.0000 mg | ORAL_TABLET | Freq: Every day | ORAL | 3 refills | Status: DC

## 2023-01-01 NOTE — Addendum Note (Signed)
Addended by: Chevis Pretty on: 01/01/2023 11:51 AM   Modules accepted: Orders

## 2023-01-06 ENCOUNTER — Telehealth: Payer: Self-pay | Admitting: *Deleted

## 2023-01-06 NOTE — Telephone Encounter (Signed)
Status: PA Response - Approved °

## 2023-01-06 NOTE — Telephone Encounter (Signed)
Marissa Wells  ([employer]) PA Case ID #: [id number] Rx #: [id number]   Status: Sent to Plan today  Drug: Mounjaro 7.5MG /0.5ML pen-injectors

## 2023-01-08 ENCOUNTER — Telehealth: Payer: Self-pay

## 2023-01-08 NOTE — Telephone Encounter (Signed)
Marissa Wells ([employer]) PA Case ID #: [id number] Rx #: [id number] Need Help? Call us at [phone] Outcome Approved on January 10 PA Case: [id number], Status: Approved, Coverage Starts on: 12/28/2022 12:00:00 AM, Coverage Ends on: 12/28/2023 12:00:00 AM. Questions? Contact [phone]. Authorization Expiration Date: 12/27/2023 Drug Mounjaro 7.5MG /0.5ML pen-injectors [place] [id number]  Pharmacy informed

## 2023-01-27 ENCOUNTER — Encounter: Payer: Self-pay | Admitting: Nurse Practitioner

## 2023-02-16 ENCOUNTER — Encounter: Payer: Self-pay | Admitting: Nurse Practitioner

## 2023-03-18 ENCOUNTER — Telehealth: Payer: Self-pay | Admitting: Nurse Practitioner

## 2023-03-18 MED ORDER — TIRZEPATIDE 5 MG/0.5ML ~~LOC~~ SOAJ: 5.0000 mg | SUBCUTANEOUS | 2 refills | Status: DC

## 2023-03-18 NOTE — Telephone Encounter (Signed)
Please advise 

## 2023-03-18 NOTE — Telephone Encounter (Signed)
Pt wants to know if MMM will decrease dosage back to 5MG -due to the side effects of taking tirzepatide (MOUNJARO) 7.5 MG/0.5ML Pen.

## 2023-04-14 DIAGNOSIS — R29890 Loss of height: Secondary | ICD-10-CM | POA: Diagnosis not present

## 2023-04-14 DIAGNOSIS — M8588 Other specified disorders of bone density and structure, other site: Secondary | ICD-10-CM | POA: Diagnosis not present

## 2023-04-14 DIAGNOSIS — N958 Other specified menopausal and perimenopausal disorders: Secondary | ICD-10-CM | POA: Diagnosis not present

## 2023-04-14 DIAGNOSIS — Z6825 Body mass index (BMI) 25.0-25.9, adult: Secondary | ICD-10-CM | POA: Diagnosis not present

## 2023-04-14 DIAGNOSIS — Z124 Encounter for screening for malignant neoplasm of cervix: Secondary | ICD-10-CM | POA: Diagnosis not present

## 2023-04-14 DIAGNOSIS — Z1231 Encounter for screening mammogram for malignant neoplasm of breast: Secondary | ICD-10-CM | POA: Diagnosis not present

## 2023-04-14 DIAGNOSIS — N952 Postmenopausal atrophic vaginitis: Secondary | ICD-10-CM | POA: Diagnosis not present

## 2023-04-14 DIAGNOSIS — E039 Hypothyroidism, unspecified: Secondary | ICD-10-CM | POA: Diagnosis not present

## 2023-05-07 ENCOUNTER — Telehealth: Payer: Self-pay | Admitting: Nurse Practitioner

## 2023-05-07 NOTE — Telephone Encounter (Signed)
Contacted Marissa Wells to schedule their annual wellness visit. Appointment made for 09/13/2023.  Thank you,  Marissa Wells,  AMB Clinical Support [facility] AWV Program Direct Dial ??[phone]

## 2023-05-21 ENCOUNTER — Encounter: Payer: Self-pay | Admitting: Nurse Practitioner

## 2023-05-28 ENCOUNTER — Encounter: Payer: Self-pay | Admitting: Nurse Practitioner

## 2023-06-29 ENCOUNTER — Ambulatory Visit (INDEPENDENT_AMBULATORY_CARE_PROVIDER_SITE_OTHER): Payer: Medicare Other

## 2023-06-29 ENCOUNTER — Encounter: Payer: Self-pay | Admitting: Nurse Practitioner

## 2023-06-29 ENCOUNTER — Ambulatory Visit (INDEPENDENT_AMBULATORY_CARE_PROVIDER_SITE_OTHER): Payer: Medicare Other | Admitting: Nurse Practitioner

## 2023-06-29 VITALS — BP 130/73 | HR 75 | Temp 98.2°F | Resp 20 | Ht 66.0 in

## 2023-06-29 DIAGNOSIS — E785 Hyperlipidemia, unspecified: Secondary | ICD-10-CM

## 2023-06-29 DIAGNOSIS — F5101 Primary insomnia: Secondary | ICD-10-CM

## 2023-06-29 DIAGNOSIS — Z6827 Body mass index (BMI) 27.0-27.9, adult: Secondary | ICD-10-CM | POA: Diagnosis not present

## 2023-06-29 DIAGNOSIS — E1169 Type 2 diabetes mellitus with other specified complication: Secondary | ICD-10-CM | POA: Diagnosis not present

## 2023-06-29 DIAGNOSIS — Z7985 Long-term (current) use of injectable non-insulin antidiabetic drugs: Secondary | ICD-10-CM

## 2023-06-29 DIAGNOSIS — Z87891 Personal history of nicotine dependence: Secondary | ICD-10-CM | POA: Diagnosis not present

## 2023-06-29 DIAGNOSIS — E039 Hypothyroidism, unspecified: Secondary | ICD-10-CM

## 2023-06-29 DIAGNOSIS — E119 Type 2 diabetes mellitus without complications: Secondary | ICD-10-CM | POA: Diagnosis not present

## 2023-06-29 DIAGNOSIS — F3340 Major depressive disorder, recurrent, in remission, unspecified: Secondary | ICD-10-CM

## 2023-06-29 DIAGNOSIS — I10 Essential (primary) hypertension: Secondary | ICD-10-CM

## 2023-06-29 DIAGNOSIS — Z6829 Body mass index (BMI) 29.0-29.9, adult: Secondary | ICD-10-CM

## 2023-06-29 LAB — BAYER DCA HB A1C WAIVED: HB A1C (BAYER DCA - WAIVED): 5.2 % (ref 4.8–5.6)

## 2023-06-29 MED ORDER — SERTRALINE HCL 100 MG PO TABS
100.0000 mg | ORAL_TABLET | Freq: Every day | ORAL | 1 refills | Status: DC
Start: 2023-06-29 — End: 2024-01-04

## 2023-06-29 MED ORDER — ROSUVASTATIN CALCIUM 20 MG PO TABS: 20.0000 mg | ORAL_TABLET | Freq: Every day | ORAL | 3 refills | Status: DC

## 2023-06-29 MED ORDER — LEVOTHYROXINE SODIUM 75 MCG PO TABS
75.0000 ug | ORAL_TABLET | Freq: Every day | ORAL | 1 refills | Status: DC
Start: 2023-06-29 — End: 2024-01-04

## 2023-06-29 MED ORDER — AMLODIPINE BESYLATE 5 MG PO TABS
5.0000 mg | ORAL_TABLET | Freq: Every day | ORAL | 1 refills | Status: DC
Start: 2023-06-29 — End: 2024-01-04

## 2023-06-29 MED ORDER — TIRZEPATIDE 5 MG/0.5ML ~~LOC~~ SOAJ: 5.0000 mg | SUBCUTANEOUS | 2 refills | Status: DC

## 2023-06-29 MED ORDER — ZOLPIDEM TARTRATE 10 MG PO TABS
10.0000 mg | ORAL_TABLET | Freq: Every evening | ORAL | 5 refills | Status: DC | PRN
Start: 2023-06-29 — End: 2024-01-04

## 2023-06-29 NOTE — Progress Notes (Signed)
Subjective:    Patient ID: Marissa Wells, female    DOB: 1951/06/30, 72 y.o.   MRN: [id number]   Chief Complaint: No chief complaint on file.    HPI:  Marissa Wells is a 72 y.o. who identifies as a female who was assigned female at birth.   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 Primary hypertension No c/o chest pain, sob or headache. Does not check blood pressure at home. BP Readings from Last 3 Encounters:  12/31/22 (!) 143/84  06/22/22 121/69  03/16/22 128/76     2. Hyperlipidemia associated with type 2 diabetes mellitus (HCC) Does try to watch diet. Does no dedicated exercise. Lab Results  Component Value Date   CHOL 199 12/31/2022   HDL 53 12/31/2022   LDLCALC 130 (H) 12/31/2022   TRIG 88 12/31/2022   CHOLHDL 3.8 12/31/2022     3. Long-term current use of injectable noninsulin antidiabetic medication Is currently on mounjario and is doing well. Does not check blood sugars at home Lab Results  Component Value Date   HGBA1C 5.8 (H) 12/31/2022     4. Acquired hypothyroidism No issues that she is aware of Lab Results  Component Value Date   TSH 3.380 06/22/2022     5. Recurrent major depressive disorder, in remission [facility]) Has been on zoloft for several years and is doing well.    12/31/2022    2:03 PM 09/09/2022    8:45 AM 06/22/2022    2:17 PM  Depression screen PHQ 2/9  Decreased Interest 0 0 0  Down, Depressed, Hopeless 0 0 0  PHQ - 2 Score 0 0 0  Altered sleeping 0 0 0  Tired, decreased energy 0 0 0  Change in appetite 0 0 0  Feeling bad or failure about yourself  0 0 0  Trouble concentrating 0 0 0  Moving slowly or fidgety/restless 0 0 0  Suicidal thoughts 0 0 0  PHQ-9 Score 0 0 0  Difficult doing work/chores Not difficult at all Not difficult at all Not difficult at all     6. Primary insomnia Takes ambien to  sleep at night. Unable to sleep without taking  7. Former smoker Quit smoking several years ago. Has never had low dose [facility].   8. BMI 29.0-29.9,adult Weight is down 23lbs Wt Readings from Last 3 Encounters:  09/09/22 173 lb (78.5 kg)  06/22/22 173 lb (78.5 kg)  03/16/22 196 lb (88.9 kg)   BMI Readings from Last 3 Encounters:  06/29/23 27.92 kg/m  12/31/22 27.92 kg/m  09/09/22 27.92 kg/m      New complaints: None  today  Allergies  Allergen Reactions   Compazine [Prochlorperazine Edisylate]    Prochlorperazine Other (See Comments)   Outpatient Encounter Medications as of 06/29/2023  Medication Sig   amLODipine (NORVASC) 5 MG tablet Take 1 tablet (5 mg total) by mouth daily.   aspirin 81 MG tablet Take 81 mg by mouth daily.   Calcium 200 MG TABS Calcium   Calcium-Magnesium-Vitamin D 600-40-500 MG-MG-UNIT TB24 Take by mouth.   levothyroxine (SYNTHROID) 75 MCG tablet Take 1 tablet (75 mcg total) by mouth daily.   Multiple Vitamin (MULTIVITAMIN ADULT PO) Multivitamin   rosuvastatin (CRESTOR) 20 MG tablet Take 1 tablet (20 mg total) by mouth daily.   sertraline (ZOLOFT) 100 MG tablet Take 1 tablet (  100 mg total) by mouth daily.   tirzepatide [facility]) 5 MG/0.5ML Pen Inject 5 mg into the skin once a week.   zolpidem (AMBIEN) 10 MG tablet Take 1 tablet (10 mg total) by mouth at bedtime as needed for sleep.   No facility-administered encounter medications on file as of 06/29/2023.    Past Surgical History:  Procedure Laterality Date   CHOLECYSTECTOMY     TUBAL LIGATION      Family History  Problem Relation Age of Onset   Breast cancer Sister    Hypertension Sister    Diabetes Father    Hyperlipidemia Father    Hypertension Daughter    Diabetes Son    Hypertension Son    Hypertension Sister       Controlled substance contract: 01/05/23     Review of Systems  Constitutional:  Negative for diaphoresis.  Eyes:  Negative for pain.  Respiratory:  Negative for  shortness of breath.   Cardiovascular:  Negative for chest pain, palpitations and leg swelling.  Gastrointestinal:  Negative for abdominal pain.  Endocrine: Negative for polydipsia.  Skin:  Negative for rash.  Neurological:  Negative for dizziness, weakness and headaches.  Hematological:  Does not bruise/bleed easily.  All other systems reviewed and are negative.      Objective:   Physical Exam Vitals and nursing note reviewed.  Constitutional:      General: She is not in acute distress.    Appearance: Normal appearance. She is well-developed.  HENT:     Head: Normocephalic.     Right Ear: Tympanic membrane normal.     Left Ear: Tympanic membrane normal.     Nose: Nose normal.     Mouth/Throat:     Mouth: Mucous membranes are moist.  Eyes:     Pupils: Pupils are equal, round, and reactive to light.  Neck:     Vascular: No carotid bruit or JVD.  Cardiovascular:     Rate and Rhythm: Normal rate and regular rhythm.     Heart sounds: Normal heart sounds.  Pulmonary:     Effort: Pulmonary effort is normal. No respiratory distress.     Breath sounds: Normal breath sounds. No wheezing or rales.  Chest:     Chest wall: No tenderness.  Abdominal:     General: Bowel sounds are normal. There is no distension or abdominal bruit.     Palpations: Abdomen is soft. There is no hepatomegaly, splenomegaly, mass or pulsatile mass.     Tenderness: There is no abdominal tenderness.  Musculoskeletal:        General: Normal range of motion.     Cervical back: Normal range of motion and neck supple.  Lymphadenopathy:     Cervical: No cervical adenopathy.  Skin:    General: Skin is warm and dry.  Neurological:     Mental Status: She is alert and oriented to person, place, and time.     Deep Tendon Reflexes: Reflexes are normal and symmetric.  Psychiatric:        Behavior: Behavior normal.        Thought Content: Thought content normal.        Judgment: Judgment normal.    BP 130/73    Pulse 75   Temp 98.2 F (36.8 C) (Temporal)   Resp 20   Ht 5\' 6"  (1.676 m)   SpO2 98%   BMI 27.92 kg/m   HGBA1c 5.2%       Assessment & Plan:  ISLAROSE LOCKHEART comes in today with chief complaint of Medical Management of Chronic Issues   Diagnosis and orders addressed:  1. Primary hypertension Low sodium diet - EKG 12-Lead - CBC with Differential/Platelet - CMP14+EGFR - amLODipine (NORVASC) 5 MG tablet; Take 1 tablet (5 mg total) by mouth daily.  Dispense: 90 tablet; Refill: 1  2. Hyperlipidemia associated with type 2 diabetes mellitus (HCC) Low fat diet - Lipid panel  3. Long-term current use of injectable noninsulin antidiabetic medication Continue to watch carbs in diet - Microalbumin / creatinine urine ratio - Bayer DCA Hb A1c Waived  4. Acquired hypothyroidism Labs pending - Thyroid Panel With TSH - levothyroxine (SYNTHROID) 75 MCG tablet; Take 1 tablet (75 mcg total) by mouth daily.  Dispense: 90 tablet; Refill: 1  5. Recurrent major depressive disorder, in remission ([facility]) Stress management - sertraline (ZOLOFT) 100 MG tablet; Take 1 tablet (100 mg total) by mouth daily.  Dispense: 90 tablet; Refill: 1  6. Primary insomnia Bedtime routine - zolpidem (AMBIEN) 10 MG tablet; Take 1 tablet (10 mg total) by mouth at bedtime as needed for sleep.  Dispense: 30 tablet; Refill: 5  7. Former smoker Ordered low dose CT scan - DG Chest 2 View - Ambulatory Referral for Lung Cancer Scre  8. BMI 29.0-29.9,adult Discussed diet and exercise for person with BMI >25 Will recheck weight in 3-6 months    Labs pending Health Maintenance reviewed Diet and exercise encouraged  Follow up plan: 6 months   Mary-Margaret Daphine Deutscher, FNP

## 2023-06-29 NOTE — Patient Instructions (Signed)

## 2023-06-30 LAB — CBC WITH DIFFERENTIAL/PLATELET
Basophils Absolute: 0.1 10*3/uL (ref 0.0–0.2)
Basos: 1 %
EOS (ABSOLUTE): 0.2 10*3/uL (ref 0.0–0.4)
Eos: 2 %
Hematocrit: 42.1 % (ref 34.0–46.6)
Hemoglobin: 13.7 g/dL (ref 11.1–15.9)
Immature Grans (Abs): 0 10*3/uL (ref 0.0–0.1)
Immature Granulocytes: 0 %
Lymphocytes Absolute: 4.2 10*3/uL — ABNORMAL HIGH (ref 0.7–3.1)
Lymphs: 54 %
MCH: 29.9 pg (ref 26.6–33.0)
MCHC: 32.5 g/dL (ref 31.5–35.7)
MCV: 92 fL (ref 79–97)
Monocytes Absolute: 0.4 10*3/uL (ref 0.1–0.9)
Monocytes: 6 %
Neutrophils Absolute: 2.8 10*3/uL (ref 1.4–7.0)
Neutrophils: 37 %
Platelets: 354 10*3/uL (ref 150–450)
RBC: 4.58 x10E6/uL (ref 3.77–5.28)
RDW: 14.5 % (ref 11.7–15.4)
WBC: 7.7 10*3/uL (ref 3.4–10.8)

## 2023-06-30 LAB — CMP14+EGFR
ALT: 12 IU/L (ref 0–32)
AST: 17 IU/L (ref 0–40)
Albumin: 4.6 g/dL (ref 3.9–4.9)
Alkaline Phosphatase: 115 IU/L (ref 44–121)
BUN/Creatinine Ratio: 15 (ref 12–28)
BUN: 15 mg/dL (ref 8–27)
Bilirubin Total: <0.2 mg/dL (ref 0.0–1.2)
CO2: 21 mmol/L (ref 20–29)
Calcium: 9.6 mg/dL (ref 8.7–10.3)
Chloride: 104 mmol/L (ref 96–106)
Creatinine, Ser: 1 mg/dL (ref 0.57–1.00)
Globulin, Total: 3.3 g/dL (ref 1.5–4.5)
Glucose: 83 mg/dL (ref 70–99)
Potassium: 4.6 mmol/L (ref 3.5–5.2)
Sodium: 140 mmol/L (ref 134–144)
Total Protein: 7.9 g/dL (ref 6.0–8.5)
eGFR: 61 mL/min/{1.73_m2} (ref >59)

## 2023-06-30 LAB — MICROALBUMIN / CREATININE URINE RATIO
Creatinine, Urine: 123 mg/dL
Microalb/Creat Ratio: 4 mg/g creat (ref 0–29)
Microalbumin, Urine: 5.5 ug/mL

## 2023-06-30 LAB — LIPID PANEL
Chol/HDL Ratio: 2.7 ratio (ref 0.0–4.4)
Cholesterol, Total: 167 mg/dL (ref 100–199)
HDL: 62 mg/dL (ref >39)
LDL Chol Calc (NIH): 88 mg/dL (ref 0–99)
Triglycerides: 94 mg/dL (ref 0–149)
VLDL Cholesterol Cal: 17 mg/dL (ref 5–40)

## 2023-06-30 LAB — THYROID PANEL WITH TSH
Free Thyroxine Index: 2.6 (ref 1.2–4.9)
T3 Uptake Ratio: 27 % (ref 24–39)
T4, Total: 9.5 ug/dL (ref 4.5–12.0)
TSH: 2.44 u[IU]/mL (ref 0.450–4.500)

## 2023-09-03 ENCOUNTER — Telehealth: Payer: Self-pay | Admitting: Nurse Practitioner

## 2023-09-13 ENCOUNTER — Ambulatory Visit (INDEPENDENT_AMBULATORY_CARE_PROVIDER_SITE_OTHER): Payer: Medicare Other

## 2023-09-13 VITALS — Ht 67.0 in | Wt 145.0 lb

## 2023-09-13 DIAGNOSIS — Z0000 Encounter for general adult medical examination without abnormal findings: Secondary | ICD-10-CM

## 2023-09-13 NOTE — Progress Notes (Signed)
Subjective:   Marissa Wells is a 72 y.o. female who presents for Medicare Annual (Subsequent) preventive examination.  Visit Complete: Virtual  I connected with  [name] on 09/13/23 by a audio enabled telemedicine application and verified that I am speaking with the correct person using two identifiers.  Patient Location: Home  Provider Location: Home Office  I discussed the limitations of evaluation and management by telemedicine. The patient expressed understanding and agreed to proceed.  Patient Medicare AWV questionnaire was completed by the patient on 09/13/2023; I have confirmed that all information answered by patient is correct and no changes since this date.  Cardiac Risk Factors include: advanced age (>26men, >8 women);diabetes mellitus;dyslipidemia;hypertensionVital Signs: Unable to obtain new vitals due to this being a telehealth visit.  Nutrition Risk Assessment:  Has the patient had any N/V/D within the last 2 months?  No  Does the patient have any non-healing wounds?  No  Has the patient had any unintentional weight loss or weight gain?  No   Diabetes:  Is the patient diabetic?  Yes  If diabetic, was a CBG obtained today?  No  Did the patient bring in their glucometer from home?  No  How often do you monitor your CBG's? Never   Financial Strains and Diabetes Management:  Are you having any financial strains with the device, your supplies or your medication? No .  Does the patient want to be seen by Chronic Care Management for management of their diabetes?  No  Would the patient like to be referred to a Nutritionist or for Diabetic Management?  No   Diabetic Exams:  Diabetic Eye Exam: Completed 10/2022 Diabetic Foot Exam: Overdue, Pt has been advised about the importance in completing this exam. Pt is scheduled for diabetic foot exam on nextoffice visit .     Objective:    Today's Vitals   09/13/23 0821  Weight: 145 lb (65.8 kg)  Height: 5\' 7"  (1.702  m)   Body mass index is 22.71 kg/m.     09/13/2023    8:24 AM 09/09/2022    8:47 AM 09/08/2021    8:35 AM 03/19/2020    2:38 PM  Advanced Directives  Does Patient Have a Medical Advance Directive? No No No Yes  Type of Advance Directive    Healthcare Power of Attorney  Would patient like information on creating a medical advance directive? Yes (MAU/Ambulatory/Procedural Areas - Information given) No - Patient declined Yes (MAU/Ambulatory/Procedural Areas - Information given)     Current Medications (verified) Outpatient Encounter Medications as of 09/13/2023  Medication Sig   amLODipine (NORVASC) 5 MG tablet Take 1 tablet (5 mg total) by mouth daily.   aspirin 81 MG tablet Take 81 mg by mouth daily.   Calcium 200 MG TABS Calcium   Calcium-Magnesium-Vitamin D 600-40-500 MG-MG-UNIT TB24 Take by mouth.   levothyroxine (SYNTHROID) 75 MCG tablet Take 1 tablet (75 mcg total) by mouth daily.   Multiple Vitamin (MULTIVITAMIN ADULT PO) Multivitamin   rosuvastatin (CRESTOR) 20 MG tablet Take 1 tablet (20 mg total) by mouth daily.   sertraline (ZOLOFT) 100 MG tablet Take 1 tablet (100 mg total) by mouth daily.   tirzepatide [facility]) 5 MG/0.5ML Pen Inject 5 mg into the skin once a week.   zolpidem (AMBIEN) 10 MG tablet Take 1 tablet (10 mg total) by mouth at bedtime as needed for sleep.   No facility-administered encounter medications on file as of 09/13/2023.    Allergies (verified)  Compazine [prochlorperazine edisylate] and Prochlorperazine   History: Past Medical History:  Diagnosis Date   Hyperlipidemia    Hypertension    Thyroid disease    Past Surgical History:  Procedure Laterality Date   CHOLECYSTECTOMY     TUBAL LIGATION     Family History  Problem Relation Age of Onset   Breast cancer Sister    Hypertension Sister    Diabetes Father    Hyperlipidemia Father    Hypertension Daughter    Diabetes Son    Hypertension Son    Hypertension Sister    Social History    Socioeconomic History   Marital status: Married    Spouse name: Marissa Wells   Number of children: 2   Years of education: 16   Highest education level: Bachelor's degree (e.g., BA, AB, BS)  Occupational History   Occupation: diability specialist  Tobacco Use   Smoking status: Former   Smokeless tobacco: Never  Vaping Use   Vaping status: Never Used  Substance and Sexual Activity   Alcohol use: No   Drug use: No   Sexual activity: Not on file  Other Topics Concern   Not on file  Social History Narrative   Kiralynn works as a Scientist, research (life sciences), 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 She lives with her husband Marissa Wells. She has a daughter and son both located in [place]. She has a Emergency planning/management officer and she enjoys gardening.    Social Determinants of Health   Financial Resource Strain: Low Risk  (09/13/2023)   Overall Financial Resource Strain (CARDIA)    Difficulty of Paying Living Expenses: Not hard at all  Food Insecurity: No Food Insecurity (09/13/2023)   Hunger Vital Sign    Worried About Running Out of Food in the Last Year: Never true    Ran Out of Food in the Last Year: Never true  Transportation Needs: No Transportation Needs (09/13/2023)   PRAPARE - Administrator, Civil Service (Medical): No    Lack of Transportation (Non-Medical): No  Physical Activity: Insufficiently Active (09/13/2023)   Exercise Vital Sign    Days of Exercise per Week: 3 days    Minutes of Exercise per Session: 30 min  Stress: No Stress Concern Present (09/13/2023)   [employer] of Occupational Health - Occupational Stress Questionnaire    Feeling of Stress : Not at all  Social Connections: Moderately Integrated (09/13/2023)   Social Connection and Isolation Panel [NHANES]    Frequency of Communication with Friends and Family: More than three times a week    Frequency of Social Gatherings with Friends and Family: More than three times a week    Attends Religious Services: More than 4  times per year    Active Member of [employer] or Organizations: No    Attends Engineer, structural: Never    Marital Status: Married    Tobacco Counseling Counseling given: Not Answered   Clinical Intake:  Pre-visit preparation completed: Yes  Pain : No/denies pain     Nutritional Risks: None Diabetes: Yes CBG done?: No Did pt. bring in CBG monitor from home?: No  How often do you need to have someone help you when you read instructions, pamphlets, or other written materials from your doctor or pharmacy?: 1 - Never  Interpreter Needed?: No  Information entered by :: Renie Ora, LPN   Activities of Daily Living    09/13/2023    8:25 AM 09/10/2023    8:09 AM  In your present state of health, do you have any difficulty performing the following activities:  Hearing? 0 0  Vision? 0 0  Difficulty concentrating or making decisions? 0 0  Walking or climbing stairs? 0 0  Dressing or bathing? 0 0  Doing errands, shopping? 0 0  Preparing Food and eating ? N N  Using the Toilet? N N  In the past six months, have you accidently leaked urine? N N  Do you have problems with loss of bowel control? N N  Managing your Medications? N N  Managing your Finances? N N  Housekeeping or managing your Housekeeping? N N    Patient Care Team: Bennie Pierini, FNP as PCP - General (Nurse Practitioner) Harold Hedge, MD as Consulting Physician (Obstetrics and Gynecology) [facility], [facility]  Indicate any recent Medical Services you may have received from other than [facility] providers in the past year (date may be approximate).     Assessment:   This is a routine wellness examination for Cheyenna.  Hearing/Vision screen Vision Screening - Comments:: Wears rx glasses - up to date with routine eye exams with  Dr.Johnson    Goals Addressed             This Visit's Progress    DIET - EAT MORE FRUITS AND VEGETABLES   On track      Depression Screen     09/13/2023    8:23 AM 06/29/2023    2:40 PM 12/31/2022    2:03 PM 09/09/2022    8:45 AM 06/22/2022    2:17 PM 03/16/2022    2:20 PM 09/12/2021    2:43 PM  PHQ 2/9 Scores  PHQ - 2 Score 0 0 0 0 0 0 0  PHQ- 9 Score  0 0 0 0 0 0    Fall Risk    09/13/2023    8:22 AM 09/10/2023    8:09 AM 06/29/2023    2:40 PM 12/31/2022    2:03 PM 09/09/2022    8:48 AM  Fall Risk   Falls in the past year? 0 0 0 0 0  Number falls in past yr: 0    0  Injury with Fall? 0    0  Risk for fall due to : No Fall Risks    No Fall Risks  Follow up Falls prevention discussed    Falls evaluation completed    MEDICARE RISK AT HOME: Medicare Risk at Home Any stairs in or around the home?: Yes If so, are there any without handrails?: No Home free of loose throw rugs in walkways, pet beds, electrical cords, etc?: Yes Adequate lighting in your home to reduce risk of falls?: Yes Life alert?: No Use of a cane, walker or w/c?: No Grab bars in the bathroom?: Yes Shower chair or bench in shower?: Yes Elevated toilet seat or a handicapped toilet?: Yes  TIMED UP AND GO:  Was the test performed?  No    Cognitive Function:        09/13/2023    8:25 AM 09/09/2022    8:50 AM 03/19/2020    2:39 PM  6CIT Screen  What Year? 0 points 0 points 0 points  What month? 0 points 0 points 0 points  What time? 0 points 0 points 0 points  Count back from 20 0 points 0 points 0 points  Months in reverse 0 points 0 points 0 points  Repeat phrase 0 points 0 points 0 points  Total  Score 0 points 0 points 0 points    Immunizations Immunization History  Administered Date(s) Administered   Tdap 03/16/2022   Zoster Recombinant(Shingrix) 11/29/2020, 03/30/2021    TDAP status: Up to date  Flu Vaccine status: Due, Education has been provided regarding the importance of this vaccine. Advised may receive this vaccine at local pharmacy or Health Dept. Aware to provide a copy of the vaccination record if obtained from local pharmacy or  Health Dept. Verbalized acceptance and understanding.  Pneumococcal vaccine status: Due, Education has been provided regarding the importance of this vaccine. Advised may receive this vaccine at local pharmacy or Health Dept. Aware to provide a copy of the vaccination record if obtained from local pharmacy or Health Dept. Verbalized acceptance and understanding.  Covid-19 vaccine status: Completed vaccines  Qualifies for Shingles Vaccine? Yes   Zostavax completed Yes   Shingrix Completed?: Yes  Screening Tests Health Maintenance  Topic Date Due   Pneumonia Vaccine 44+ Years old (1 of 1 - PCV) Never done   OPHTHALMOLOGY EXAM  04/09/2023   INFLUENZA VACCINE  Never done   COVID-19 Vaccine (1 - 2023-24 season) Never done   HEMOGLOBIN A1C  12/30/2023   FOOT EXAM  01/01/2024   MAMMOGRAM  04/13/2024   Diabetic kidney evaluation - eGFR measurement  06/28/2024   Diabetic kidney evaluation - Urine ACR  06/28/2024   Medicare Annual Wellness (AWV)  09/12/2024   Fecal DNA (Cologuard)  03/23/2025   DEXA SCAN  04/13/2028   DTaP/Tdap/Td (2 - Td or Tdap) 03/16/2032   Hepatitis C Screening  Completed   Zoster Vaccines- Shingrix  Completed   HPV VACCINES  Aged Out    Health Maintenance  Health Maintenance Due  Topic Date Due   Pneumonia Vaccine 52+ Years old (1 of 1 - PCV) Never done   OPHTHALMOLOGY EXAM  04/09/2023   INFLUENZA VACCINE  Never done   COVID-19 Vaccine (1 - 2023-24 season) Never done    Colorectal cancer screening: Type of screening: Cologuard. Completed 03/23/2022. Repeat every 3 years  Mammogram status: Completed 04/14/2023. Repeat every year  Bone Density status: Completed 04/14/2023. Results reflect: Bone density results: OSTEOPENIA. Repeat every 5 years.  Lung Cancer Screening: (Low Dose CT Chest recommended if Age 29-80 years, 20 pack-year currently smoking OR have quit w/in 15years.) does qualify.   Lung Cancer Screening Referral: declined   Additional  Screening:  Hepatitis C Screening: does not qualify; Completed 05/11/2016  Vision Screening: Recommended annual ophthalmology exams for early detection of glaucoma and other disorders of the eye. Is the patient up to date with their annual eye exam?  Yes  Who is the provider or what is the name of the office in which the patient attends annual eye exams? Dr.Johnson  If pt is not established with a provider, would they like to be referred to a provider to establish care? No .   Dental Screening: Recommended annual dental exams for proper oral hygiene   Community Resource Referral / Chronic Care Management: CRR required this visit?  No   CCM required this visit?  No     Plan:     I have personally reviewed and noted the following in the patient's chart:   Medical and social history Use of alcohol, tobacco or illicit drugs  Current medications and supplements including opioid prescriptions. Patient is not currently taking opioid prescriptions. Functional ability and status Nutritional status Physical activity Advanced directives List of other physicians Hospitalizations, surgeries, and ER visits  in previous 12 months Vitals Screenings to include cognitive, depression, and falls Referrals and appointments  In addition, I have reviewed and discussed with patient certain preventive protocols, quality metrics, and best practice recommendations. A written personalized care plan for preventive services as well as general preventive health recommendations were provided to patient.     Lorrene Reid, LPN   04/05/8118   After Visit Summary: (MyChart) Due to this being a telephonic visit, the after visit summary with patients personalized plan was offered to patient via MyChart   Nurse Notes: none

## 2023-09-13 NOTE — Patient Instructions (Signed)
Marissa Wells , Thank you for taking time to come for your Medicare Wellness Visit. I appreciate your ongoing commitment to your health goals. Please review the following plan we discussed and let me know if I can assist you in the future.   Referrals/Orders/Follow-Ups/Clinician Recommendations: Aim for 30 minutes of exercise or brisk walking, 6-8 glasses of water, and 5 servings of fruits and vegetables each day.   This is a list of the screening recommended for you and due dates:  Health Maintenance  Topic Date Due   Pneumonia Vaccine (1 of 1 - PCV) Never done   Eye exam for diabetics  04/09/2023   Flu Shot  Never done   COVID-19 Vaccine (1 - 2023-24 season) Never done   Hemoglobin A1C  12/30/2023   Complete foot exam   01/01/2024   Mammogram  04/13/2024   Yearly kidney function blood test for diabetes  06/28/2024   Yearly kidney health urinalysis for diabetes  06/28/2024   Medicare Annual Wellness Visit  09/12/2024   Cologuard (Stool DNA test)  03/23/2025   DEXA scan (bone density measurement)  04/13/2028   DTaP/Tdap/Td vaccine (2 - Td or Tdap) 03/16/2032   Hepatitis C Screening  Completed   Zoster (Shingles) Vaccine  Completed   HPV Vaccine  Aged Out    Advanced directives: (Provided) Advance directive discussed with you today. I have provided a copy for you to complete at home and have notarized. Once this is complete, please bring a copy in to our office so we can scan it into your chart. Information on Advanced Care Planning can be found at [facility] Advance Health Care Directives Advance Health Care Directives ([email or web address]    Next Medicare Annual Wellness Visit scheduled for next year: Yes  Insert Preventive Care attachment Insert FALL PREVENTION attachment if needed

## 2023-12-06 ENCOUNTER — Telehealth: Payer: Self-pay | Admitting: *Deleted

## 2023-12-06 NOTE — Telephone Encounter (Unsigned)
Copied from CRM [id number]. Topic: Clinical - Medication Question >> Dec 06, 2023  4:13 PM Joanette Gula wrote: Reason for CRM: Pt wants to know if the PA for her tirzepatide [facility]) 5 MG/0.5ML Pen has been done

## 2023-12-07 ENCOUNTER — Telehealth: Payer: Self-pay

## 2023-12-07 ENCOUNTER — Other Ambulatory Visit (HOSPITAL_COMMUNITY): Payer: Self-pay

## 2023-12-07 NOTE — Telephone Encounter (Signed)
Pharmacy Patient Advocate Encounter   Received notification from Pt Calls Messages that prior authorization for [facility] is required/requested.   Insurance verification completed.   The patient is insured through [place] .   Per test claim: Refill too soon. PA is not needed at this time. Medication was filled 12/06/23. Next eligible fill date is 12/30/23.

## 2023-12-07 NOTE — Telephone Encounter (Signed)
PER TEST CLAIM: Refill too soon. PA is not needed at this time. Medication was filled 12/06/23. Next eligible fill date is 12/30/23.

## 2024-01-04 ENCOUNTER — Ambulatory Visit (INDEPENDENT_AMBULATORY_CARE_PROVIDER_SITE_OTHER): Payer: Medicare Other | Admitting: Nurse Practitioner

## 2024-01-04 ENCOUNTER — Encounter: Payer: Self-pay | Admitting: Nurse Practitioner

## 2024-01-04 VITALS — BP 133/72 | HR 71 | Temp 98.3°F | Resp 20 | Ht 67.0 in | Wt 152.0 lb

## 2024-01-04 DIAGNOSIS — F5101 Primary insomnia: Secondary | ICD-10-CM | POA: Diagnosis not present

## 2024-01-04 DIAGNOSIS — E782 Mixed hyperlipidemia: Secondary | ICD-10-CM | POA: Diagnosis not present

## 2024-01-04 DIAGNOSIS — F3340 Major depressive disorder, recurrent, in remission, unspecified: Secondary | ICD-10-CM | POA: Diagnosis not present

## 2024-01-04 DIAGNOSIS — E1169 Type 2 diabetes mellitus with other specified complication: Secondary | ICD-10-CM | POA: Diagnosis not present

## 2024-01-04 DIAGNOSIS — E039 Hypothyroidism, unspecified: Secondary | ICD-10-CM | POA: Diagnosis not present

## 2024-01-04 DIAGNOSIS — I10 Essential (primary) hypertension: Secondary | ICD-10-CM

## 2024-01-04 DIAGNOSIS — E119 Type 2 diabetes mellitus without complications: Secondary | ICD-10-CM | POA: Diagnosis not present

## 2024-01-04 DIAGNOSIS — Z7985 Long-term (current) use of injectable non-insulin antidiabetic drugs: Secondary | ICD-10-CM | POA: Diagnosis not present

## 2024-01-04 DIAGNOSIS — Z6829 Body mass index (BMI) 29.0-29.9, adult: Secondary | ICD-10-CM

## 2024-01-04 LAB — BAYER DCA HB A1C WAIVED: HB A1C (BAYER DCA - WAIVED): 5.1 % (ref 4.8–5.6)

## 2024-01-04 MED ORDER — SERTRALINE HCL 100 MG PO TABS
100.0000 mg | ORAL_TABLET | Freq: Every day | ORAL | 1 refills | Status: DC
Start: 2024-01-04 — End: 2024-07-04

## 2024-01-04 MED ORDER — AMLODIPINE BESYLATE 5 MG PO TABS
5.0000 mg | ORAL_TABLET | Freq: Every day | ORAL | 1 refills | Status: DC
Start: 2024-01-04 — End: 2024-07-04

## 2024-01-04 MED ORDER — LEVOTHYROXINE SODIUM 75 MCG PO TABS: 75.0000 ug | ORAL_TABLET | Freq: Every day | ORAL | 1 refills | Status: DC

## 2024-01-04 MED ORDER — ROSUVASTATIN CALCIUM 20 MG PO TABS: 20.0000 mg | ORAL_TABLET | Freq: Every day | ORAL | 1 refills | Status: DC

## 2024-01-04 MED ORDER — ZOLPIDEM TARTRATE 10 MG PO TABS
10.0000 mg | ORAL_TABLET | Freq: Every evening | ORAL | 5 refills | Status: DC | PRN
Start: 2024-01-04 — End: 2024-07-04

## 2024-01-04 MED ORDER — TIRZEPATIDE 5 MG/0.5ML ~~LOC~~ SOAJ: 5.0000 mg | SUBCUTANEOUS | 2 refills | Status: DC

## 2024-01-04 NOTE — Progress Notes (Signed)
 Subjective:    Patient ID: Marissa Wells, female    DOB: 1951-09-11, 73 y.o.   MRN: [id number]   Chief Complaint: No chief complaint on file.    HPI:  Marissa Wells is a 73 y.o. who identifies as a female who was assigned female at birth.   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 Primary hypertension No c/o chest pain, sob or headache. Does not check blood pressure at home. BP Readings from Last 3 Encounters:  06/29/23 130/73  12/31/22 (!) 143/84  06/22/22 121/69     2. Hyperlipidemia associated with type 2 diabetes mellitus (HCC) Does try to watch diet. Does no dedicated exercise. Lab Results  Component Value Date   CHOL 167 06/29/2023   HDL 62 06/29/2023   LDLCALC 88 06/29/2023   TRIG 94 06/29/2023   CHOLHDL 2.7 06/29/2023     3. Long-term current use of injectable noninsulin antidiabetic medication Is currently on mounjario and is doing well. Does not check blood sugars at home Lab Results  Component Value Date   HGBA1C 5.2 06/29/2023     4. Acquired hypothyroidism No issues that she is aware of Lab Results  Component Value Date   TSH 2.440 06/29/2023     5. Recurrent major depressive disorder, in remission (HCC) Has been on zoloft  for several years and is doing well.    01/04/2024    3:00 PM 09/13/2023    8:23 AM 06/29/2023    2:40 PM  Depression screen PHQ 2/9  Decreased Interest 0 0 0  Down, Depressed, Hopeless 0 0 0  PHQ - 2 Score 0 0 0  Altered sleeping 0  0  Tired, decreased energy 0  0  Change in appetite 0  0  Feeling bad or failure about yourself  0  0  Trouble concentrating 0  0  Moving slowly or fidgety/restless 0  0  Suicidal thoughts 0  0  PHQ-9 Score 0  0  Difficult doing work/chores Not difficult at all  Not difficult at all       6. Primary insomnia Takes ambien  to sleep at night. Unable to sleep  without taking  7. Former smoker Quit smoking several years ago. Has never had low dose CT san.   8. BMI 29.0-29.9,adult Weight is up 7lbs  Wt Readings from Last 3 Encounters:  01/04/24 152 lb (68.9 kg)  09/13/23 145 lb (65.8 kg)  09/09/22 173 lb (78.5 kg)   BMI Readings from Last 3 Encounters:  01/04/24 23.81 kg/m  09/13/23 22.71 kg/m  06/29/23 27.92 kg/m         New complaints: None  today  Allergies  Allergen Reactions   Compazine [Prochlorperazine Edisylate]    Prochlorperazine Other (See Comments)   Outpatient Encounter Medications as of 01/04/2024  Medication Sig   amLODipine  (NORVASC ) 5 MG tablet Take 1 tablet (5 mg total) by mouth daily.   aspirin 81 MG tablet Take 81 mg by mouth daily.   Calcium  200 MG TABS Calcium    Calcium -Magnesium-Vitamin D 600-40-500 MG-MG-UNIT TB24 Take by mouth.   levothyroxine  (SYNTHROID ) 75 MCG tablet Take 1 tablet (75 mcg total) by mouth daily.   Multiple Vitamin (MULTIVITAMIN ADULT PO) Multivitamin   rosuvastatin  (CRESTOR ) 20 MG tablet Take 1 tablet (20 mg total) by mouth daily.   sertraline  (ZOLOFT ) 100 MG tablet Take 1  tablet (100 mg total) by mouth daily.   tirzepatide  (MOUNJARO ) 5 MG/0.5ML Pen Inject 5 mg into the skin once a week.   zolpidem  (AMBIEN ) 10 MG tablet Take 1 tablet (10 mg total) by mouth at bedtime as needed for sleep.   No facility-administered encounter medications on file as of 01/04/2024.    Past Surgical History:  Procedure Laterality Date   CHOLECYSTECTOMY     TUBAL LIGATION      Family History  Problem Relation Age of Onset   Breast cancer Sister    Hypertension Sister    Diabetes Father    Hyperlipidemia Father    Hypertension Daughter    Diabetes Son    Hypertension Son    Hypertension Sister       Controlled substance contract: 01/05/23     Review of Systems  Constitutional:  Negative for diaphoresis.  Eyes:  Negative for pain.  Respiratory:  Negative for shortness of breath.    Cardiovascular:  Negative for chest pain, palpitations and leg swelling.  Gastrointestinal:  Negative for abdominal pain.  Endocrine: Negative for polydipsia.  Skin:  Negative for rash.  Neurological:  Negative for dizziness, weakness and headaches.  Hematological:  Does not bruise/bleed easily.  All other systems reviewed and are negative.      Objective:   Physical Exam Vitals and nursing note reviewed.  Constitutional:      General: She is not in acute distress.    Appearance: Normal appearance. She is well-developed.  HENT:     Head: Normocephalic.     Right Ear: Tympanic membrane normal.     Left Ear: Tympanic membrane normal.     Nose: Nose normal.     Mouth/Throat:     Mouth: Mucous membranes are moist.  Eyes:     Pupils: Pupils are equal, round, and reactive to light.  Neck:     Vascular: No carotid bruit or JVD.  Cardiovascular:     Rate and Rhythm: Normal rate and regular rhythm.     Heart sounds: Normal heart sounds.  Pulmonary:     Effort: Pulmonary effort is normal. No respiratory distress.     Breath sounds: Normal breath sounds. No wheezing or rales.  Chest:     Chest wall: No tenderness.  Abdominal:     General: Bowel sounds are normal. There is no distension or abdominal bruit.     Palpations: Abdomen is soft. There is no hepatomegaly, splenomegaly, mass or pulsatile mass.     Tenderness: There is no abdominal tenderness.  Musculoskeletal:        General: Normal range of motion.     Cervical back: Normal range of motion and neck supple.  Lymphadenopathy:     Cervical: No cervical adenopathy.  Skin:    General: Skin is warm and dry.  Neurological:     Mental Status: She is alert and oriented to person, place, and time.     Deep Tendon Reflexes: Reflexes are normal and symmetric.  Psychiatric:        Behavior: Behavior normal.        Thought Content: Thought content normal.        Judgment: Judgment normal.    BP 133/72   Pulse 71   Temp 98.3  F (36.8 C) (Temporal)   Resp 20   Ht 5' 7 (1.702 m)   Wt 152 lb (68.9 kg)   SpO2 98%   BMI 23.81 kg/m      HGBA1c 5.1%  Assessment & Plan:   CAMBREY LUPI comes in today with chief complaint of No chief complaint on file.   Diagnosis and orders addressed:  1. Primary hypertension Low sodium diet - EKG 12-Lead - CBC with Differential/Platelet - CMP14+EGFR - amLODipine  (NORVASC ) 5 MG tablet; Take 1 tablet (5 mg total) by mouth daily.  Dispense: 90 tablet; Refill: 1  2. Hyperlipidemia associated with type 2 diabetes mellitus (HCC) Low fat diet - Lipid panel  3. Long-term current use of injectable noninsulin antidiabetic medication Continue to watch carbs in diet - Microalbumin / creatinine urine ratio - Bayer DCA Hb A1c Waived  4. Acquired hypothyroidism Labs pending - Thyroid  Panel With TSH - levothyroxine  (SYNTHROID ) 75 MCG tablet; Take 1 tablet (75 mcg total) by mouth daily.  Dispense: 90 tablet; Refill: 1  5. Recurrent major depressive disorder, in remission (HCC) Stress management - sertraline  (ZOLOFT ) 100 MG tablet; Take 1 tablet (100 mg total) by mouth daily.  Dispense: 90 tablet; Refill: 1  6. Primary insomnia Bedtime routine - zolpidem  (AMBIEN ) 10 MG tablet; Take 1 tablet (10 mg total) by mouth at bedtime as needed for sleep.  Dispense: 30 tablet; Refill: 5  7. Former smoker Ordered low dose CT scan - DG Chest 2 View - Ambulatory Referral for Lung Cancer Scre  8. BMI 29.0-29.9,adult Discussed diet and exercise for person with BMI >25 Will recheck weight in 3-6 months    Labs pending Health Maintenance reviewed Diet and exercise encouraged  Follow up plan: 6 months   Mary-Margaret Gladis, FNP

## 2024-01-04 NOTE — Patient Instructions (Signed)
 Insomnia Insomnia is a sleep disorder that makes it difficult to fall asleep or stay asleep. Insomnia can cause fatigue, low energy, difficulty concentrating, mood swings, and poor performance at work or school. There are three different ways to classify insomnia: Difficulty falling asleep. Difficulty staying asleep. Waking up too early in the morning. Any type of insomnia can be long-term (chronic) or short-term (acute). Both are common. Short-term insomnia usually lasts for 3 months or less. Chronic insomnia occurs at least three times a week for longer than 3 months. What are the causes? Insomnia may be caused by another condition, situation, or substance, such as: Having certain mental health conditions, such as anxiety and depression. Using caffeine, alcohol , tobacco, or drugs. Having gastrointestinal conditions, such as gastroesophageal reflux disease (GERD). Having certain medical conditions. These include: Asthma. Alzheimer's disease. Stroke. Chronic pain. An overactive thyroid  gland (hyperthyroidism). Other sleep disorders, such as restless legs syndrome and sleep apnea. Menopause. Sometimes, the cause of insomnia may not be known. What increases the risk? Risk factors for insomnia include: Gender. Females are affected more often than males. Age. Insomnia is more common as people get older. Stress and certain medical and mental health conditions. Lack of exercise. Having an irregular work schedule. This may include working night shifts and traveling between different time zones. What are the signs or symptoms? If you have insomnia, the main symptom is having trouble falling asleep or having trouble staying asleep. This may lead to other symptoms, such as: Feeling tired or having low energy. Feeling nervous about going to sleep. Not feeling rested in the morning. Having trouble concentrating. Feeling irritable, anxious, or depressed. How is this diagnosed? This condition  may be diagnosed based on: Your symptoms and medical history. Your health care provider may ask about: Your sleep habits. Any medical conditions you have. Your mental health. A physical exam. How is this treated? Treatment for insomnia depends on the cause. Treatment may focus on treating an underlying condition that is causing the insomnia. Treatment may also include: Medicines to help you sleep. Counseling or therapy. Lifestyle adjustments to help you sleep better. Follow these instructions at home: Eating and drinking  Limit or avoid alcohol , caffeinated beverages, and products that contain nicotine and tobacco, especially close to bedtime. These can disrupt your sleep. Do not eat a large meal or eat spicy foods right before bedtime. This can lead to digestive discomfort that can make it hard for you to sleep. Sleep habits  Keep a sleep diary to help you and your health care provider figure out what could be causing your insomnia. Write down: When you sleep. When you wake up during the night. How well you sleep and how rested you feel the next day. Any side effects of medicines you are taking. What you eat and drink. Make your bedroom a dark, comfortable place where it is easy to fall asleep. Put up shades or blackout curtains to block light from outside. Use a white noise machine to block noise. Keep the temperature cool. Limit screen use before bedtime. This includes: Not watching TV. Not using your smartphone, tablet, or computer. Stick to a routine that includes going to bed and waking up at the same times every day and night. This can help you fall asleep faster. Consider making a quiet activity, such as reading, part of your nighttime routine. Try to avoid taking naps during the day so that you sleep better at night. Get out of bed if you are still awake after  15 minutes of trying to sleep. Keep the lights down, but try reading or doing a quiet activity. When you feel  sleepy, go back to bed. General instructions Take over-the-counter and prescription medicines only as told by your health care provider. Exercise regularly as told by your health care provider. However, avoid exercising in the hours right before bedtime. Use relaxation techniques to manage stress. Ask your health care provider to suggest some techniques that may work well for you. These may include: Breathing exercises. Routines to release muscle tension. Visualizing peaceful scenes. Make sure that you drive carefully. Do not drive if you feel very sleepy. Keep all follow-up visits. This is important. Contact a health care provider if: You are tired throughout the day. You have trouble in your daily routine due to sleepiness. You continue to have sleep problems, or your sleep problems get worse. Get help right away if: You have thoughts about hurting yourself or someone else. Get help right away if you feel like you may hurt yourself or others, or have thoughts about taking your own life. Go to your nearest emergency room or: Call 911. Call the National Suicide Prevention Lifeline at 2232757840 or 988. This is open 24 hours a day. Text the Crisis Text Line at 657-529-4371. Summary Insomnia is a sleep disorder that makes it difficult to fall asleep or stay asleep. Insomnia can be long-term (chronic) or short-term (acute). Treatment for insomnia depends on the cause. Treatment may focus on treating an underlying condition that is causing the insomnia. Keep a sleep diary to help you and your health care provider figure out what could be causing your insomnia. This information is not intended to replace advice given to you by your health care provider. Make sure you discuss any questions you have with your health care provider. Document Revised: 11/24/2021 Document Reviewed: 11/24/2021 Elsevier Patient Education  2024 [employer].

## 2024-01-05 LAB — LIPID PANEL
Chol/HDL Ratio: 3 {ratio} (ref 0.0–4.4)
Cholesterol, Total: 194 mg/dL (ref 100–199)
HDL: 64 mg/dL (ref >39)
LDL Chol Calc (NIH): 119 mg/dL — ABNORMAL HIGH (ref 0–99)
Triglycerides: 61 mg/dL (ref 0–149)
VLDL Cholesterol Cal: 11 mg/dL (ref 5–40)

## 2024-01-05 LAB — CMP14+EGFR
ALT: 8 [IU]/L (ref 0–32)
AST: 13 [IU]/L (ref 0–40)
Albumin: 4.3 g/dL (ref 3.9–4.9)
Alkaline Phosphatase: 109 [IU]/L (ref 44–121)
BUN/Creatinine Ratio: 22 (ref 12–28)
BUN: 19 mg/dL (ref 8–27)
Bilirubin Total: <0.2 mg/dL (ref 0.0–1.2)
CO2: 25 mmol/L (ref 20–29)
Calcium: 9.5 mg/dL (ref 8.7–10.3)
Chloride: 106 mmol/L (ref 96–106)
Creatinine, Ser: 0.87 mg/dL (ref 0.57–1.00)
Globulin, Total: 2.7 g/dL (ref 1.5–4.5)
Glucose: 77 mg/dL (ref 70–99)
Potassium: 4.7 mmol/L (ref 3.5–5.2)
Sodium: 144 mmol/L (ref 134–144)
Total Protein: 7 g/dL (ref 6.0–8.5)
eGFR: 72 mL/min/{1.73_m2} (ref >59)

## 2024-01-05 LAB — CBC WITH DIFFERENTIAL/PLATELET
Basophils Absolute: 0.1 10*3/uL (ref 0.0–0.2)
Basos: 1 %
EOS (ABSOLUTE): 0.4 10*3/uL (ref 0.0–0.4)
Eos: 5 %
Hematocrit: 40.1 % (ref 34.0–46.6)
Hemoglobin: 12.9 g/dL (ref 11.1–15.9)
Immature Grans (Abs): 0 10*3/uL (ref 0.0–0.1)
Immature Granulocytes: 0 %
Lymphocytes Absolute: 2.8 10*3/uL (ref 0.7–3.1)
Lymphs: 42 %
MCH: 30.6 pg (ref 26.6–33.0)
MCHC: 32.2 g/dL (ref 31.5–35.7)
MCV: 95 fL (ref 79–97)
Monocytes Absolute: 0.5 10*3/uL (ref 0.1–0.9)
Monocytes: 7 %
Neutrophils Absolute: 3 10*3/uL (ref 1.4–7.0)
Neutrophils: 45 %
Platelets: 366 10*3/uL (ref 150–450)
RBC: 4.22 x10E6/uL (ref 3.77–5.28)
RDW: 13.9 % (ref 11.7–15.4)
WBC: 6.7 10*3/uL (ref 3.4–10.8)

## 2024-01-05 LAB — THYROID PANEL WITH TSH
Free Thyroxine Index: 2.3 (ref 1.2–4.9)
T3 Uptake Ratio: 29 % (ref 24–39)
T4, Total: 7.8 ug/dL (ref 4.5–12.0)
TSH: 2.62 u[IU]/mL (ref 0.450–4.500)

## 2024-06-27 ENCOUNTER — Ambulatory Visit (INDEPENDENT_AMBULATORY_CARE_PROVIDER_SITE_OTHER)

## 2024-06-27 DIAGNOSIS — Z7985 Long-term (current) use of injectable non-insulin antidiabetic drugs: Secondary | ICD-10-CM

## 2024-06-27 DIAGNOSIS — E119 Type 2 diabetes mellitus without complications: Secondary | ICD-10-CM

## 2024-06-27 LAB — HM DIABETES EYE EXAM

## 2024-06-27 NOTE — Progress Notes (Signed)
 Marissa Wells arrived 06/27/2024 and has given verbal consent to obtain images and complete their overdue diabetic retinal screening.  The images have been sent to an ophthalmologist or optometrist for review and interpretation.  Results will be sent back to Gladis Mustard, FNP for review.  Patient has been informed they will be contacted when we receive the results via telephone or MyChart

## 2024-07-04 ENCOUNTER — Encounter: Payer: Self-pay | Admitting: Nurse Practitioner

## 2024-07-04 ENCOUNTER — Ambulatory Visit (INDEPENDENT_AMBULATORY_CARE_PROVIDER_SITE_OTHER): Payer: Medicare Other | Admitting: Nurse Practitioner

## 2024-07-04 VITALS — BP 121/58 | HR 76 | Temp 97.4°F | Ht 67.0 in | Wt 148.0 lb

## 2024-07-04 DIAGNOSIS — E039 Hypothyroidism, unspecified: Secondary | ICD-10-CM | POA: Diagnosis not present

## 2024-07-04 DIAGNOSIS — I10 Essential (primary) hypertension: Secondary | ICD-10-CM

## 2024-07-04 DIAGNOSIS — F3340 Major depressive disorder, recurrent, in remission, unspecified: Secondary | ICD-10-CM | POA: Diagnosis not present

## 2024-07-04 DIAGNOSIS — E119 Type 2 diabetes mellitus without complications: Secondary | ICD-10-CM

## 2024-07-04 DIAGNOSIS — Z7985 Long-term (current) use of injectable non-insulin antidiabetic drugs: Secondary | ICD-10-CM | POA: Diagnosis not present

## 2024-07-04 DIAGNOSIS — Z6823 Body mass index (BMI) 23.0-23.9, adult: Secondary | ICD-10-CM

## 2024-07-04 DIAGNOSIS — E1169 Type 2 diabetes mellitus with other specified complication: Secondary | ICD-10-CM

## 2024-07-04 DIAGNOSIS — E782 Mixed hyperlipidemia: Secondary | ICD-10-CM

## 2024-07-04 DIAGNOSIS — F5101 Primary insomnia: Secondary | ICD-10-CM | POA: Diagnosis not present

## 2024-07-04 LAB — BAYER DCA HB A1C WAIVED: HB A1C (BAYER DCA - WAIVED): 5.6 % (ref 4.8–5.6)

## 2024-07-04 MED ORDER — TIRZEPATIDE 5 MG/0.5ML ~~LOC~~ SOAJ: 5.0000 mg | SUBCUTANEOUS | 5 refills | Status: DC

## 2024-07-04 MED ORDER — ROSUVASTATIN CALCIUM 20 MG PO TABS: 20.0000 mg | ORAL_TABLET | Freq: Every day | ORAL | 1 refills | Status: DC

## 2024-07-04 MED ORDER — SERTRALINE HCL 100 MG PO TABS: 100.0000 mg | ORAL_TABLET | Freq: Every day | ORAL | 1 refills | Status: DC

## 2024-07-04 MED ORDER — AMLODIPINE BESYLATE 5 MG PO TABS: 5.0000 mg | ORAL_TABLET | Freq: Every day | ORAL | 1 refills | Status: DC

## 2024-07-04 MED ORDER — LEVOTHYROXINE SODIUM 75 MCG PO TABS: 75.0000 ug | ORAL_TABLET | Freq: Every day | ORAL | 1 refills | Status: DC

## 2024-07-04 MED ORDER — ZOLPIDEM TARTRATE 10 MG PO TABS: 10.0000 mg | ORAL_TABLET | Freq: Every evening | ORAL | 5 refills | Status: DC | PRN

## 2024-07-04 NOTE — Patient Instructions (Signed)
 Exercising to Stay Healthy To become healthy and stay healthy, it is recommended that you do moderate-intensity and vigorous-intensity exercise. You can tell that you are exercising at a moderate intensity if your heart starts beating faster and you start breathing faster but can still hold a conversation. You can tell that you are exercising at a vigorous intensity if you are breathing much harder and faster and cannot hold a conversation while exercising. How can exercise benefit me? Exercising regularly is important. It has many health benefits, such as: Improving overall fitness, flexibility, and endurance. Increasing bone density. Helping with weight control. Decreasing body fat. Increasing muscle strength and endurance. Reducing stress and tension, anxiety, depression, or anger. Improving overall health. What guidelines should I follow while exercising? Before you start a new exercise program, talk with your health care provider. Do not exercise so much that you hurt yourself, feel dizzy, or get very short of breath. Wear comfortable clothes and wear shoes with good support. Drink plenty of water while you exercise to prevent dehydration or heat stroke. Work out until your breathing and your heartbeat get faster (moderate intensity). How often should I exercise? Choose an activity that you enjoy, and set realistic goals. Your health care provider can help you make an activity plan that is individually designed and works best for you. Exercise regularly as told by your health care provider. This may include: Doing strength training two times a week, such as: Lifting weights. Using resistance bands. Push-ups. Sit-ups. Yoga. Doing a certain intensity of exercise for a given amount of time. Choose from these options: A total of 150 minutes of moderate-intensity exercise every week. A total of 75 minutes of vigorous-intensity exercise every week. A mix of moderate-intensity and  vigorous-intensity exercise every week. Children, pregnant women, people who have not exercised regularly, people who are overweight, and older adults may need to talk with a health care provider about what activities are safe to perform. If you have a medical condition, be sure to talk with your health care provider before you start a new exercise program. What are some exercise ideas? Moderate-intensity exercise ideas include: Walking 1 mile (1.6 km) in about 15 minutes. Biking. Hiking. Golfing. Dancing. Water aerobics. Vigorous-intensity exercise ideas include: Walking 4.5 miles (7.2 km) or more in about 1 hour. Jogging or running 5 miles (8 km) in about 1 hour. Biking 10 miles (16.1 km) or more in about 1 hour. Lap swimming. Roller-skating or in-line skating. Cross-country skiing. Vigorous competitive sports, such as football, basketball, and soccer. Jumping rope. Aerobic dancing. What are some everyday activities that can help me get exercise? Yard work, such as: Child psychotherapist. Raking and bagging leaves. Washing your car. Pushing a stroller. Shoveling snow. Gardening. Washing windows or floors. How can I be more active in my day-to-day activities? Use stairs instead of an elevator. Take a walk during your lunch break. If you drive, park your car farther away from your work or school. If you take public transportation, get off one stop early and walk the rest of the way. Stand up or walk around during all of your indoor phone calls. Get up, stretch, and walk around every 30 minutes throughout the day. Enjoy exercise with a friend. Support to continue exercising will help you keep a regular routine of activity. Where to find more information You can find more information about exercising to stay healthy from: U.S. Department of Health and Human Services: ThisPath.fi Centers for Disease Control and Prevention (  CDC): [employer].br Summary Exercising regularly is  important. It will improve your overall fitness, flexibility, and endurance. Regular exercise will also improve your overall health. It can help you control your weight, reduce stress, and improve your bone density. Do not exercise so much that you hurt yourself, feel dizzy, or get very short of breath. Before you start a new exercise program, talk with your health care provider. This information is not intended to replace advice given to you by your health care provider. Make sure you discuss any questions you have with your health care provider. Document Revised: 04/11/2021 Document Reviewed: 04/11/2021 Elsevier Patient Education  2024 [employer].

## 2024-07-04 NOTE — Progress Notes (Signed)
 Subjective:    Patient ID: Marissa Wells, female    DOB: 1951-05-06, 73 y.o.   MRN: [id number]   Chief Complaint: medical management of chronic issues     HPI:  Marissa Wells is a 73 y.o. who identifies as a female who was assigned female at birth.   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 Primary hypertension No c/o chest pain, sob or headache. Does not check blood pressure at home. BP Readings from Last 3 Encounters:  01/04/24 133/72  06/29/23 130/73  12/31/22 (!) 143/84     2. Hyperlipidemia associated with type 2 diabetes mellitus (HCC) Does try to watch diet. Does no dedicated exercise. Lab Results  Component Value Date   CHOL 194 01/04/2024   HDL 64 01/04/2024   LDLCALC 119 (H) 01/04/2024   TRIG 61 01/04/2024   CHOLHDL 3.0 01/04/2024   The 10-year ASCVD risk score (Arnett DK, et al., 2019) is: 23.6%   3. Long-term current use of injectable noninsulin antidiabetic medication Is currently on mounjario and is doing well. Does not check blood sugars at home Lab Results  Component Value Date   HGBA1C 5.1 01/04/2024     4. Acquired hypothyroidism No issues that she is aware of Lab Results  Component Value Date   TSH 2.620 01/04/2024     5. Recurrent major depressive disorder, in remission [facility]) Has been on zoloft  for several years and is doing well.     07/04/2024    3:00 PM 01/04/2024    3:00 PM 09/13/2023    8:23 AM  Depression screen PHQ 2/9  Decreased Interest 0 0 0  Down, Depressed, Hopeless 0 0 0  PHQ - 2 Score 0 0 0  Altered sleeping 0 0   Tired, decreased energy 0 0   Change in appetite 0 0   Feeling bad or failure about yourself  0 0   Trouble concentrating 0 0   Moving slowly or fidgety/restless 0 0   Suicidal thoughts 0 0   PHQ-9 Score 0 0   Difficult doing work/chores Not difficult at all Not difficult at all        07/04/2024    3:00 PM 01/04/2024    3:01 PM 06/29/2023    2:40 PM 12/31/2022    2:03 PM  GAD 7 : Generalized Anxiety Score  Nervous, Anxious, on Edge 0 0 0 0  Control/stop worrying 0 0 0 0  Worry too much - different things 0 0 0 0  Trouble relaxing 0 0 0 0  Restless 0 0 0 0  Easily annoyed or irritable 0 0 0 0  Afraid - awful might happen 0 0 0 0  Total GAD 7 Score 0 0 0 0  Anxiety Difficulty  Not difficult at all Not difficult at all Not difficult at all     6. Primary insomnia Takes ambien  to sleep at night. Unable to sleep without taking  7. Former smoker Quit smoking several years ago. Has never had low dose CT san.   8. BMI 23.0-23.9,adult Weight is down 4lbs   Wt Readings from Last 3 Encounters:  07/04/24 148 lb (67.1 kg)  01/04/24 152 lb (68.9 kg)  09/13/23 145 lb (65.8 kg)   BMI Readings from Last 3 Encounters:  07/04/24 23.18 kg/m  01/04/24 23.81 kg/m  09/13/23 22.71 kg/m  New complaints: None  today  Allergies  Allergen Reactions   Compazine [Prochlorperazine Edisylate]    Prochlorperazine Other (See Comments)   Outpatient Encounter Medications as of 07/04/2024  Medication Sig   amLODipine  (NORVASC ) 5 MG tablet Take 1 tablet (5 mg total) by mouth daily.   aspirin 81 MG tablet Take 81 mg by mouth daily.   Calcium  200 MG TABS Calcium    Calcium -Magnesium-Vitamin D 600-40-500 MG-MG-UNIT TB24 Take by mouth.   levothyroxine  (SYNTHROID ) 75 MCG tablet Take 1 tablet (75 mcg total) by mouth daily.   Multiple Vitamin (MULTIVITAMIN ADULT PO) Multivitamin   rosuvastatin  (CRESTOR ) 20 MG tablet Take 1 tablet (20 mg total) by mouth daily.   sertraline  (ZOLOFT ) 100 MG tablet Take 1 tablet (100 mg total) by mouth daily.   tirzepatide  (MOUNJARO ) 5 MG/0.5ML Pen Inject 5 mg into the skin once a week.   zolpidem  (AMBIEN ) 10 MG tablet Take 1 tablet (10 mg total) by mouth at bedtime as needed for sleep.   No facility-administered encounter medications on file as  of 07/04/2024.    Past Surgical History:  Procedure Laterality Date   CHOLECYSTECTOMY     TUBAL LIGATION      Family History  Problem Relation Age of Onset   Breast cancer Sister    Hypertension Sister    Diabetes Father    Hyperlipidemia Father    Hypertension Daughter    Diabetes Son    Hypertension Son    Hypertension Sister       Controlled substance contract: 01/05/23     Review of Systems  Constitutional:  Negative for diaphoresis.  Eyes:  Negative for pain.  Respiratory:  Negative for shortness of breath.   Cardiovascular:  Negative for chest pain, palpitations and leg swelling.  Gastrointestinal:  Negative for abdominal pain.  Endocrine: Negative for polydipsia.  Skin:  Negative for rash.  Neurological:  Negative for dizziness, weakness and headaches.  Hematological:  Does not bruise/bleed easily.  All other systems reviewed and are negative.      Objective:   Physical Exam Vitals and nursing note reviewed.  Constitutional:      General: She is not in acute distress.    Appearance: Normal appearance. She is well-developed.  HENT:     Head: Normocephalic.     Right Ear: Tympanic membrane normal.     Left Ear: Tympanic membrane normal.     Nose: Nose normal.     Mouth/Throat:     Mouth: Mucous membranes are moist.  Eyes:     Pupils: Pupils are equal, round, and reactive to light.  Neck:     Vascular: No carotid bruit or JVD.  Cardiovascular:     Rate and Rhythm: Normal rate and regular rhythm.     Heart sounds: Normal heart sounds.  Pulmonary:     Effort: Pulmonary effort is normal. No respiratory distress.     Breath sounds: Normal breath sounds. No wheezing or rales.  Chest:     Chest wall: No tenderness.  Abdominal:     General: Bowel sounds are normal. There is no distension or abdominal bruit.     Palpations: Abdomen is soft. There is no hepatomegaly, splenomegaly, mass or pulsatile mass.     Tenderness: There is no abdominal tenderness.   Musculoskeletal:        General: Normal range of motion.     Cervical back: Normal range of motion and neck supple.  Lymphadenopathy:     Cervical: No cervical  adenopathy.  Skin:    General: Skin is warm and dry.  Neurological:     Mental Status: She is alert and oriented to person, place, and time.     Deep Tendon Reflexes: Reflexes are normal and symmetric.  Psychiatric:        Behavior: Behavior normal.        Thought Content: Thought content normal.        Judgment: Judgment normal.   BP (!) 121/58   Pulse 76   Temp (!) 97.4 F (36.3 C) (Temporal)   Ht 5' 7 (1.702 m)   Wt 148 lb (67.1 kg)   SpO2 95%   BMI 23.18 kg/m     HGBA1c 5.6%       Assessment & Plan:   Marissa Wells comes in today with chief complaint of medical management of chronic issues    Diagnosis and orders addressed:  1. Primary hypertension Low sodium diet - EKG 12-Lead - CBC with Differential/Platelet - CMP14+EGFR - amLODipine  (NORVASC ) 5 MG tablet; Take 1 tablet (5 mg total) by mouth daily.  Dispense: 90 tablet; Refill: 1  2. Hyperlipidemia associated with type 2 diabetes mellitus (HCC) Low fat diet - Lipid panel  3. Long-term current use of injectable noninsulin antidiabetic medication Continue to watch carbs in diet - Microalbumin / creatinine urine ratio - Bayer DCA Hb A1c Waived  4. Acquired hypothyroidism Labs pending - Thyroid  Panel With TSH - levothyroxine  (SYNTHROID ) 75 MCG tablet; Take 1 tablet (75 mcg total) by mouth daily.  Dispense: 90 tablet; Refill: 1  5. Recurrent major depressive disorder, in remission (HCC) Stress management - sertraline  (ZOLOFT ) 100 MG tablet; Take 1 tablet (100 mg total) by mouth daily.  Dispense: 90 tablet; Refill: 1  6. Primary insomnia Bedtime routine - zolpidem  (AMBIEN ) 10 MG tablet; Take 1 tablet (10 mg total) by mouth at bedtime as needed for sleep.  Dispense: 30 tablet; Refill: 5  7. Former smoker Ordered low dose CT scan - DG Chest  2 View - Ambulatory Referral for Lung Cancer Scre  8. BMI 23.0-23.9,adult Discussed diet and exercise for person with BMI >25 Will recheck weight in 3-6 months    Labs pending Health Maintenance reviewed Diet and exercise encouraged  Follow up plan: 6 months   Mary-Margaret Gladis, FNP

## 2024-07-05 LAB — CMP14+EGFR
ALT: 7 IU/L (ref 0–32)
AST: 16 IU/L (ref 0–40)
Albumin: 4.3 g/dL (ref 3.9–4.9)
Alkaline Phosphatase: 105 IU/L (ref 44–121)
BUN/Creatinine Ratio: 18 (ref 12–28)
BUN: 16 mg/dL (ref 8–27)
Bilirubin Total: 0.2 mg/dL (ref 0.0–1.2)
CO2: 20 mmol/L (ref 20–29)
Calcium: 9.5 mg/dL (ref 8.7–10.3)
Chloride: 107 mmol/L — ABNORMAL HIGH (ref 96–106)
Creatinine, Ser: 0.91 mg/dL (ref 0.57–1.00)
Globulin, Total: 2.7 g/dL (ref 1.5–4.5)
Glucose: 79 mg/dL (ref 70–99)
Potassium: 4.2 mmol/L (ref 3.5–5.2)
Sodium: 140 mmol/L (ref 134–144)
Total Protein: 7 g/dL (ref 6.0–8.5)
eGFR: 68 mL/min/1.73 (ref >59)

## 2024-07-05 LAB — CBC WITH DIFFERENTIAL/PLATELET
Basophils Absolute: 0 x10E3/uL (ref 0.0–0.2)
Basos: 1 %
EOS (ABSOLUTE): 0.1 x10E3/uL (ref 0.0–0.4)
Eos: 2 %
Hematocrit: 40.6 % (ref 34.0–46.6)
Hemoglobin: 13.3 g/dL (ref 11.1–15.9)
Immature Grans (Abs): 0 x10E3/uL (ref 0.0–0.1)
Immature Granulocytes: 0 %
Lymphocytes Absolute: 3.3 x10E3/uL — ABNORMAL HIGH (ref 0.7–3.1)
Lymphs: 46 %
MCH: 31 pg (ref 26.6–33.0)
MCHC: 32.8 g/dL (ref 31.5–35.7)
MCV: 95 fL (ref 79–97)
Monocytes Absolute: 0.4 x10E3/uL (ref 0.1–0.9)
Monocytes: 5 %
Neutrophils Absolute: 3.2 x10E3/uL (ref 1.4–7.0)
Neutrophils: 46 %
Platelets: 330 x10E3/uL (ref 150–450)
RBC: 4.29 x10E6/uL (ref 3.77–5.28)
RDW: 13.6 % (ref 11.7–15.4)
WBC: 7.1 x10E3/uL (ref 3.4–10.8)

## 2024-07-05 LAB — MICROALBUMIN / CREATININE URINE RATIO
Creatinine, Urine: 82.5 mg/dL
Microalb/Creat Ratio: 4 mg/g{creat} (ref 0–29)
Microalbumin, Urine: 3 ug/mL

## 2024-07-05 LAB — LIPID PANEL
Chol/HDL Ratio: 3.2 ratio (ref 0.0–4.4)
Cholesterol, Total: 174 mg/dL (ref 100–199)
HDL: 55 mg/dL (ref >39)
LDL Chol Calc (NIH): 106 mg/dL — ABNORMAL HIGH (ref 0–99)
Triglycerides: 66 mg/dL (ref 0–149)
VLDL Cholesterol Cal: 13 mg/dL (ref 5–40)

## 2024-07-06 ENCOUNTER — Ambulatory Visit: Payer: Self-pay | Admitting: Nurse Practitioner

## 2024-11-09 ENCOUNTER — Ambulatory Visit

## 2024-11-27 ENCOUNTER — Telehealth: Payer: Self-pay | Admitting: Nurse Practitioner

## 2024-11-27 DIAGNOSIS — E119 Type 2 diabetes mellitus without complications: Secondary | ICD-10-CM

## 2024-11-27 NOTE — Telephone Encounter (Unsigned)
 Copied from CRM #[id number]. Topic: Clinical - Medication Refill >> Nov 27, 2024  8:53 AM Cherylann S wrote: Medication: tirzepatide  (MOUNJARO ) 5 MG/0.5ML Pen   Has the patient contacted their pharmacy? Yes (Agent: If no, request that the patient contact the pharmacy for the refill. If patient does not wish to contact the pharmacy document the reason why and proceed with request.) (Agent: If yes, when and what did the pharmacy advise?)  This is the patient's preferred pharmacy:  [id number] - [place], [place] - [address] [place] [address] Phone: [phone] Fax: [phone]  Is this the correct pharmacy for this prescription? Yes If no, delete pharmacy and type the correct one.   Has the prescription been filled recently? No  Is the patient out of the medication? Yes  Has the patient been seen for an appointment in the last year OR does the patient have an upcoming appointment? Yes  Can we respond through MyChart? Yes  Agent: Please be advised that Rx refills may take up to 3 business days. We ask that you follow-up with your pharmacy.

## 2024-11-30 MED ORDER — TIRZEPATIDE 5 MG/0.5ML ~~LOC~~ SOAJ: 5.0000 mg | SUBCUTANEOUS | 5 refills | Status: AC

## 2025-01-03 ENCOUNTER — Ambulatory Visit (INDEPENDENT_AMBULATORY_CARE_PROVIDER_SITE_OTHER): Payer: Self-pay

## 2025-01-03 VITALS — BP 119/71 | HR 80 | Temp 98.0°F | Ht 67.0 in | Wt 147.0 lb

## 2025-01-03 DIAGNOSIS — Z0000 Encounter for general adult medical examination without abnormal findings: Secondary | ICD-10-CM

## 2025-01-03 NOTE — Progress Notes (Signed)
 "  Chief Complaint  Patient presents with   Medicare Wellness     Subjective:   Marissa Wells is a 74 y.o. female who presents for a Medicare Annual Wellness Visit.  Visit info / Clinical Intake: Medicare Wellness Visit Type:: Subsequent Annual Wellness Visit Persons participating in visit and providing information:: patient Medicare Wellness Visit Mode:: In-person (required for WTM) Interpreter Needed?: No Pre-visit prep was completed: yes AWV questionnaire completed by patient prior to visit?: yes Date:: 01/02/25 Living arrangements:: (Patient-Rptd) lives with spouse/significant other Patient's Overall Health Status Rating: (Patient-Rptd) good Typical amount of pain: (Patient-Rptd) none Does pain affect daily life?: (Patient-Rptd) no  Dietary Habits and Nutritional Risks How many meals a day?: (Patient-Rptd) 2 Eats fruit and vegetables daily?: (!) (Patient-Rptd) no Most meals are obtained by: (Patient-Rptd) preparing own meals In the last 2 weeks, have you had any of the following?: none Diabetic:: (!) yes Any non-healing wounds?: no How often do you check your BS?: 3 Would you like to be referred to a Nutritionist or for Diabetic Management? : no  Functional Status Activities of Daily Living (to include ambulation/medication): (Patient-Rptd) Independent Ambulation: (Patient-Rptd) Independent Medication Administration: (Patient-Rptd) Independent Home Management (perform basic housework or laundry): (Patient-Rptd) Independent Manage your own finances?: (Patient-Rptd) yes Primary transportation is: (Patient-Rptd) driving Concerns about vision?: no *vision screening is required for WTM*  Fall Screening Falls in the past year?: (Patient-Rptd) 0 Number of falls in past year: 0 Was there an injury with Fall?: 0 Fall Risk Category Calculator: 0 Patient Fall Risk Level: Low Fall Risk  Fall Risk Patient at Risk for Falls Due to: No Fall Risks Fall risk Follow up: Falls  evaluation completed; Education provided  Home and Transportation Safety: All rugs have non-skid backing?: (Patient-Rptd) yes All stairs or steps have railings?: (Patient-Rptd) yes Grab bars in the bathtub or shower?: (Patient-Rptd) yes Have non-skid surface in bathtub or shower?: (Patient-Rptd) yes Good home lighting?: (Patient-Rptd) yes Regular seat belt use?: (Patient-Rptd) yes Hospital stays in the last year:: (Patient-Rptd) no  Cognitive Assessment Difficulty concentrating, remembering, or making decisions? : (Patient-Rptd) no Will 6CIT or Mini Cog be Completed: yes What year is it?: 0 points What month is it?: 0 points Give patient an address phrase to remember (5 components): [address] [place] what time is it?: 0 points Count backwards from 20 to 1: 0 points Say the months of the year in reverse: 0 points Repeat the address phrase from earlier: 0 points 6 CIT Score: 0 points  Advance Directives (For Healthcare) Does Patient Have a Medical Advance Directive?: No Would patient like information on creating a medical advance directive?: No - Patient declined  Reviewed/Updated  Reviewed/Updated: Reviewed All (Medical, Surgical, Family, Medications, Allergies, Care Teams, Patient Goals); Medical History; Surgical History; Family History; Medications; Allergies; Care Teams; Patient Goals    Allergies (verified) Compazine [prochlorperazine edisylate] and Prochlorperazine   Current Medications (verified) Outpatient Encounter Medications as of 01/03/2025  Medication Sig   amLODipine  (NORVASC ) 5 MG tablet Take 1 tablet (5 mg total) by mouth daily.   aspirin 81 MG tablet Take 81 mg by mouth daily.   Calcium  200 MG TABS Calcium    Calcium -Magnesium-Vitamin D 600-40-500 MG-MG-UNIT TB24 Take by mouth.   levothyroxine  (SYNTHROID ) 75 MCG tablet Take 1 tablet (75 mcg total) by mouth daily.   Multiple Vitamin (MULTIVITAMIN ADULT PO) Multivitamin   rosuvastatin  (CRESTOR ) 20 MG  tablet Take 1 tablet (20 mg total) by mouth daily.   sertraline  (  ZOLOFT ) 100 MG tablet Take 1 tablet (100 mg total) by mouth daily.   tirzepatide  (MOUNJARO ) 5 MG/0.5ML Pen Inject 5 mg into the skin once a week.   zolpidem  (AMBIEN ) 10 MG tablet Take 1 tablet (10 mg total) by mouth at bedtime as needed for sleep.   No facility-administered encounter medications on file as of 01/03/2025.    History: Past Medical History:  Diagnosis Date   Diabetes mellitus without complication (HCC)    Hyperlipidemia    Hypertension    Thyroid  disease    Past Surgical History:  Procedure Laterality Date   CHOLECYSTECTOMY     TUBAL LIGATION     Family History  Problem Relation Age of Onset   Breast cancer Sister    Hypertension Sister    Diabetes Father    Hyperlipidemia Father    Hypertension Daughter    Diabetes Son    Hypertension Son    Hypertension Sister    Social History   Occupational History   Occupation: production designer, theatre/television/film  Tobacco Use   Smoking status: Former   Smokeless tobacco: Never  Advertising Account Planner   Vaping status: Never Used  Substance and Sexual Activity   Alcohol use: Not Currently    Comment: Only socially   Drug use: Never   Sexual activity: Not Currently    Birth control/protection: None   Tobacco Counseling Counseling given: Not Answered  SDOH Screenings   Food Insecurity: No Food Insecurity (01/02/2025)  Housing: Low Risk (01/02/2025)  Transportation Needs: No Transportation Needs (01/02/2025)  Utilities: Not At Risk (09/13/2023)  Alcohol Screen: Low Risk (01/02/2025)  Depression (PHQ2-9): Low Risk (01/03/2025)  Financial Resource Strain: Low Risk (01/02/2025)  Physical Activity: Inactive (01/02/2025)  Social Connections: Moderately Integrated (01/02/2025)  Stress: Patient Declined (01/02/2025)  Tobacco Use: Medium Risk (01/03/2025)  Health Literacy: Adequate Health Literacy (01/03/2025)   See flowsheets for full screening details  Depression Screen PHQ 2 & 9 Depression  Scale- Over the past 2 weeks, how often have you been bothered by any of the following problems? Little interest or pleasure in doing things: 0 Feeling down, depressed, or hopeless (PHQ Adolescent also includes...irritable): 0 PHQ-2 Total Score: 0 Trouble falling or staying asleep, or sleeping too much: 0 Feeling tired or having little energy: 0 Poor appetite or overeating (PHQ Adolescent also includes...weight loss): 0 Feeling bad about yourself - or that you are a failure or have let yourself or your family down: 0 Trouble concentrating on things, such as reading the newspaper or watching television (PHQ Adolescent also includes...like school work): 0 Moving or speaking so slowly that other people could have noticed. Or the opposite - being so fidgety or restless that you have been moving around a lot more than usual: 0 Thoughts that you would be better off dead, or of hurting yourself in some way: 0 PHQ-9 Total Score: 0 If you checked off any problems, how difficult have these problems made it for you to do your work, take care of things at home, or get along with other people?: Not difficult at all     Goals Addressed             This Visit's Progress    DIET - EAT MORE FRUITS AND VEGETABLES   On track            Objective:    There were no vitals filed for this visit. There is no height or weight on file to calculate BMI.  Hearing/Vision screen No  results found. Immunizations and Health Maintenance Health Maintenance  Topic Date Due   FOOT EXAM  01/01/2024   Mammogram  04/13/2024   Influenza Vaccine  Never done   COVID-19 Vaccine (1 - 2025-26 season) Never done   Pneumococcal Vaccine: 50+ Years (1 of 2 - PCV) 01/03/2025 (Originally 07/10/1970)   HEMOGLOBIN A1C  01/04/2025   Fecal DNA (Cologuard)  03/23/2025   Bone Density Scan  04/13/2025   OPHTHALMOLOGY EXAM  06/27/2025   Diabetic kidney evaluation - eGFR measurement  07/04/2025   Diabetic kidney evaluation -  Urine ACR  07/04/2025   Medicare Annual Wellness (AWV)  01/03/2026   DTaP/Tdap/Td (2 - Td or Tdap) 03/16/2032   Hepatitis C Screening  Completed   Zoster Vaccines- Shingrix  Completed   Meningococcal B Vaccine  Aged Out        Assessment/Plan:  This is a routine wellness examination for Debera.  Patient Care Team: Gladis Mustard, FNP as PCP - General (Nurse Practitioner) Curlene Agent, MD as Consulting Physician (Obstetrics and Gynecology) [facility], [facility]  I have personally reviewed and noted the following in the patients chart:   Medical and social history Use of alcohol, tobacco or illicit drugs  Current medications and supplements including opioid prescriptions. Functional ability and status Nutritional status Physical activity Advanced directives List of other physicians Hospitalizations, surgeries, and ER visits in previous 12 months Vitals Screenings to include cognitive, depression, and falls Referrals and appointments  No orders of the defined types were placed in this encounter.  In addition, I have reviewed and discussed with patient certain preventive protocols, quality metrics, and best practice recommendations. A written personalized care plan for preventive services as well as general preventive health recommendations were provided to patient.   Ozie Ned, CMA   01/03/2025   Return in 1 year (on 01/03/2026).  After Visit Summary: (In Person-Printed) AVS printed and given to the patient  Nurse Notes: Pt is aware and due the following: Foot exam--will get at next OV w/pcp, Mammogram--schedule 01/16/25 per pt, Pneumonia, Covid/Flu declined per pt "

## 2025-01-03 NOTE — Patient Instructions (Signed)
 Marissa Wells,  Thank you for taking the time for your Medicare Wellness Visit. I appreciate your continued commitment to your health goals. Please review the care plan we discussed, and feel free to reach out if I can assist you further.  Please note that Annual Wellness Visits do not include a physical exam. Some assessments may be limited, especially if the visit was conducted virtually. If needed, we may recommend an in-person follow-up with your provider.  Ongoing Care Seeing your primary care provider every 3 to 6 months helps us  monitor your health and provide consistent, personalized care.   Referrals If a referral was made during today's visit and you haven't received any updates within two weeks, please contact the referred provider directly to check on the status.  Recommended Screenings:  Health Maintenance  Topic Date Due   Complete foot exam   01/01/2024   Breast Cancer Screening  04/13/2024   Flu Shot  Never done   COVID-19 Vaccine (4 - 2025-26 season) 08/28/2024   Medicare Annual Wellness Visit  09/12/2024   Pneumococcal Vaccine for age over 32 (1 of 2 - PCV) 01/03/2025*   Hemoglobin A1C  01/04/2025   Cologuard (Stool DNA test)  03/23/2025   Osteoporosis screening with Bone Density Scan  04/13/2025   Eye exam for diabetics  06/27/2025   Yearly kidney function blood test for diabetes  07/04/2025   Yearly kidney health urinalysis for diabetes  07/04/2025   DTaP/Tdap/Td vaccine (2 - Td or Tdap) 03/16/2032   Hepatitis C Screening  Completed   Zoster (Shingles) Vaccine  Completed   Meningitis B Vaccine  Aged Out  *Topic was postponed. The date shown is not the original due date.       01/02/2025    6:43 PM  Advanced Directives  Does Patient Have a Medical Advance Directive? No  Would patient like information on creating a medical advance directive? No - Patient declined    Vision: Annual vision screenings are recommended for early detection of glaucoma, cataracts, and  diabetic retinopathy. These exams can also reveal signs of chronic conditions such as diabetes and high blood pressure.  Dental: Annual dental screenings help detect early signs of oral cancer, gum disease, and other conditions linked to overall health, including heart disease and diabetes.  Please see the attached documents for additional preventive care recommendations.

## 2025-01-04 ENCOUNTER — Ambulatory Visit: Payer: Self-pay | Admitting: Nurse Practitioner

## 2025-01-08 ENCOUNTER — Ambulatory Visit: Admitting: Nurse Practitioner

## 2025-01-08 ENCOUNTER — Encounter: Payer: Self-pay | Admitting: Nurse Practitioner

## 2025-01-08 NOTE — Progress Notes (Unsigned)
 "  Subjective:    Patient ID: Marissa Wells, female    DOB: 04-06-1951, 74 y.o.   MRN: [id number]   Chief Complaint: medical management of chronic issues     HPI:  Marissa Wells is a 74 y.o. who identifies as a female who was assigned female at birth.   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 Primary hypertension No c/o chest pain, sob or headache. Does not check blood pressure at home. BP Readings from Last 3 Encounters:  01/03/25 119/71  07/04/24 (!) 121/58  01/04/24 133/72     2. Hyperlipidemia associated with type 2 diabetes mellitus (HCC) Does try to watch diet. Does no dedicated exercise. Lab Results  Component Value Date   CHOL 174 07/04/2024   HDL 55 07/04/2024   LDLCALC 106 (H) 07/04/2024   TRIG 66 07/04/2024   CHOLHDL 3.2 07/04/2024   The 10-year ASCVD risk score (Arnett DK, et al., 2019) is: 21.9%   3. Long-term current use of injectable noninsulin antidiabetic medication Is currently on mounjario and is doing well. Does not check blood sugars at home Lab Results  Component Value Date   HGBA1C 5.6 07/04/2024     4. Acquired hypothyroidism No issues that she is aware of Lab Results  Component Value Date   TSH 2.620 01/04/2024     5. Recurrent major depressive disorder, in remission (HCC) Has been on zoloft  for several years and is doing well.  ***     6. Primary insomnia Takes ambien  to sleep at night. Unable to sleep without taking  7. Former smoker Quit smoking several years ago. Has never had low dose CT san.   8. BMI 23.0-23.9,adult Weight is down 4lbs  ***    New complaints: None  today  Allergies  Allergen Reactions   Compazine [Prochlorperazine Edisylate]    Prochlorperazine Other (See Comments)   Outpatient Encounter Medications as of 01/08/2025  Medication Sig   amLODipine  (NORVASC ) 5 MG  tablet Take 1 tablet (5 mg total) by mouth daily.   aspirin 81 MG tablet Take 81 mg by mouth daily.   Calcium  200 MG TABS Calcium    Calcium -Magnesium-Vitamin D 600-40-500 MG-MG-UNIT TB24 Take by mouth.   levothyroxine  (SYNTHROID ) 75 MCG tablet Take 1 tablet (75 mcg total) by mouth daily.   Multiple Vitamin (MULTIVITAMIN ADULT PO) Multivitamin   rosuvastatin  (CRESTOR ) 20 MG tablet Take 1 tablet (20 mg total) by mouth daily.   sertraline  (ZOLOFT ) 100 MG tablet Take 1 tablet (100 mg total) by mouth daily.   tirzepatide  (MOUNJARO ) 5 MG/0.5ML Pen Inject 5 mg into the skin once a week.   zolpidem  (AMBIEN ) 10 MG tablet Take 1 tablet (10 mg total) by mouth at bedtime as needed for sleep.   No facility-administered encounter medications on file as of 01/08/2025.    Past Surgical History:  Procedure Laterality Date   CHOLECYSTECTOMY     TUBAL LIGATION      Family History  Problem Relation Age of Onset   Breast cancer Sister    Hypertension Sister    Diabetes Father    Hyperlipidemia Father    Hypertension Daughter    Diabetes Son    Hypertension Son    Hypertension Sister       Controlled substance contract: 01/05/23     Review of Systems  Constitutional:  Negative for diaphoresis.  Eyes:  Negative for pain.  Respiratory:  Negative for shortness of breath.   Cardiovascular:  Negative for chest pain, palpitations and leg swelling.  Gastrointestinal:  Negative for abdominal pain.  Endocrine: Negative for polydipsia.  Skin:  Negative for rash.  Neurological:  Negative for dizziness, weakness and headaches.  Hematological:  Does not bruise/bleed easily.  All other systems reviewed and are negative.      Objective:   Physical Exam Vitals and nursing note reviewed.  Constitutional:      General: She is not in acute distress.    Appearance: Normal appearance. She is well-developed.  HENT:     Head: Normocephalic.     Right Ear: Tympanic membrane normal.     Left Ear:  Tympanic membrane normal.     Nose: Nose normal.     Mouth/Throat:     Mouth: Mucous membranes are moist.  Eyes:     Pupils: Pupils are equal, round, and reactive to light.  Neck:     Vascular: No carotid bruit or JVD.  Cardiovascular:     Rate and Rhythm: Normal rate and regular rhythm.     Heart sounds: Normal heart sounds.  Pulmonary:     Effort: Pulmonary effort is normal. No respiratory distress.     Breath sounds: Normal breath sounds. No wheezing or rales.  Chest:     Chest wall: No tenderness.  Abdominal:     General: Bowel sounds are normal. There is no distension or abdominal bruit.     Palpations: Abdomen is soft. There is no hepatomegaly, splenomegaly, mass or pulsatile mass.     Tenderness: There is no abdominal tenderness.  Musculoskeletal:        General: Normal range of motion.     Cervical back: Normal range of motion and neck supple.  Lymphadenopathy:     Cervical: No cervical adenopathy.  Skin:    General: Skin is warm and dry.  Neurological:     Mental Status: She is alert and oriented to person, place, and time.     Deep Tendon Reflexes: Reflexes are normal and symmetric.  Psychiatric:        Behavior: Behavior normal.        Thought Content: Thought content normal.        Judgment: Judgment normal.   There were no vitals taken for this visit.    HGBA1c 5.6%       Assessment & Plan:   Marissa Wells comes in today with chief complaint of medical management of chronic issues    Diagnosis and orders addressed:  1. Primary hypertension Low sodium diet - EKG 12-Lead - CBC with Differential/Platelet - CMP14+EGFR - amLODipine  (NORVASC ) 5 MG tablet; Take 1 tablet (5 mg total) by mouth daily.  Dispense: 90 tablet; Refill: 1  2. Hyperlipidemia associated with type 2 diabetes mellitus (HCC) Low fat diet - Lipid panel  3. Long-term current use of injectable noninsulin antidiabetic medication Continue to watch carbs in diet - Microalbumin /  creatinine urine ratio - Bayer DCA Hb A1c Waived  4. Acquired hypothyroidism Labs pending - Thyroid  Panel With TSH - levothyroxine  (SYNTHROID ) 75 MCG tablet; Take 1 tablet (75 mcg total) by mouth daily.  Dispense: 90 tablet; Refill: 1  5. Recurrent major depressive disorder, in remission (HCC) Stress management - sertraline  (ZOLOFT ) 100 MG tablet; Take 1 tablet (100 mg total) by mouth daily.  Dispense: 90 tablet; Refill: 1  6. Primary insomnia Bedtime routine - zolpidem  (AMBIEN )  10 MG tablet; Take 1 tablet (10 mg total) by mouth at bedtime as needed for sleep.  Dispense: 30 tablet; Refill: 5  7. Former smoker Ordered low dose CT scan - DG Chest 2 View - Ambulatory Referral for Lung Cancer Scre  8. BMI 23.0-23.9,adult Discussed diet and exercise for person with BMI >25 Will recheck weight in 3-6 months    Labs pending Health Maintenance reviewed Diet and exercise encouraged  Follow up plan: 6 months   Mary-Margaret Gladis, FNP  "

## 2025-01-12 ENCOUNTER — Encounter: Payer: Self-pay | Admitting: Nurse Practitioner

## 2025-01-18 ENCOUNTER — Ambulatory Visit: Admitting: Nurse Practitioner

## 2025-01-18 ENCOUNTER — Encounter: Payer: Self-pay | Admitting: Nurse Practitioner

## 2025-01-18 VITALS — BP 95/64 | HR 83 | Temp 97.8°F | Ht 67.0 in | Wt 143.0 lb

## 2025-01-18 DIAGNOSIS — F3340 Major depressive disorder, recurrent, in remission, unspecified: Secondary | ICD-10-CM

## 2025-01-18 DIAGNOSIS — Z794 Long term (current) use of insulin: Secondary | ICD-10-CM

## 2025-01-18 DIAGNOSIS — E039 Hypothyroidism, unspecified: Secondary | ICD-10-CM

## 2025-01-18 DIAGNOSIS — Z87891 Personal history of nicotine dependence: Secondary | ICD-10-CM

## 2025-01-18 DIAGNOSIS — E782 Mixed hyperlipidemia: Secondary | ICD-10-CM

## 2025-01-18 DIAGNOSIS — E785 Hyperlipidemia, unspecified: Secondary | ICD-10-CM

## 2025-01-18 DIAGNOSIS — I10 Essential (primary) hypertension: Secondary | ICD-10-CM

## 2025-01-18 DIAGNOSIS — Z6829 Body mass index (BMI) 29.0-29.9, adult: Secondary | ICD-10-CM

## 2025-01-18 DIAGNOSIS — F5101 Primary insomnia: Secondary | ICD-10-CM

## 2025-01-18 DIAGNOSIS — E1169 Type 2 diabetes mellitus with other specified complication: Secondary | ICD-10-CM

## 2025-01-18 LAB — BAYER DCA HB A1C WAIVED: HB A1C (BAYER DCA - WAIVED): 5.5 % (ref 4.8–5.6)

## 2025-01-18 LAB — LIPID PANEL

## 2025-01-18 MED ORDER — ZOLPIDEM TARTRATE 10 MG PO TABS: 10.0000 mg | ORAL_TABLET | Freq: Every evening | ORAL | 1 refills | Status: AC | PRN

## 2025-01-18 MED ORDER — AMLODIPINE BESYLATE 5 MG PO TABS: 5.0000 mg | ORAL_TABLET | Freq: Every day | ORAL | 1 refills | Status: AC

## 2025-01-18 MED ORDER — ROSUVASTATIN CALCIUM 20 MG PO TABS: 20.0000 mg | ORAL_TABLET | Freq: Every day | ORAL | 1 refills | Status: AC

## 2025-01-18 MED ORDER — LEVOTHYROXINE SODIUM 75 MCG PO TABS: 75.0000 ug | ORAL_TABLET | Freq: Every day | ORAL | 1 refills | Status: AC

## 2025-01-18 MED ORDER — SERTRALINE HCL 100 MG PO TABS: 100.0000 mg | ORAL_TABLET | Freq: Every day | ORAL | 1 refills | Status: AC

## 2025-01-18 NOTE — Progress Notes (Signed)
 "  Subjective:    Patient ID: Marissa Wells, female    DOB: 1951/01/16, 74 y.o.   MRN: [id number]   Chief Complaint: medical management of chronic issues     HPI:  Marissa Wells is a 74 y.o. who identifies as a female who was assigned female at birth.   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 Primary hypertension No c/o chest pain, sob or headache. Does not check blood pressure at home. BP Readings from Last 3 Encounters:  01/03/25 119/71  07/04/24 (!) 121/58  01/04/24 133/72     2. Hyperlipidemia associated with type 2 diabetes mellitus (HCC) Does try to watch diet. Does no dedicated exercise. Lab Results  Component Value Date   CHOL 174 07/04/2024   HDL 55 07/04/2024   LDLCALC 106 (H) 07/04/2024   TRIG 66 07/04/2024   CHOLHDL 3.2 07/04/2024   The 10-year ASCVD risk score (Arnett DK, et al., 2019) is: 24%   3. Long-term current use of injectable noninsulin antidiabetic medication Is currently on mounjario and is doing well. Does not check blood sugars at home Lab Results  Component Value Date   HGBA1C 5.6 07/04/2024     4. Acquired hypothyroidism No issues that she is aware of Lab Results  Component Value Date   TSH 2.620 01/04/2024     5. Recurrent major depressive disorder, in remission (HCC) Has been on zoloft  for several years and is doing well.     01/18/2025    3:53 PM 01/03/2025   11:43 AM 07/04/2024    3:00 PM  Depression screen PHQ 2/9  Decreased Interest 0 0 0  Down, Depressed, Hopeless 0 0 0  PHQ - 2 Score 0 0 0  Altered sleeping 0  0  Tired, decreased energy 0  0  Change in appetite 0  0  Feeling bad or failure about yourself  0  0  Trouble concentrating 0  0  Moving slowly or fidgety/restless 0  0  Suicidal thoughts 0  0  PHQ-9 Score 0  0   Difficult doing work/chores Not difficult at all  Not difficult at all      Data saved with a previous flowsheet row definition        6. Primary insomnia Takes ambien  to sleep at night. Unable to sleep without taking  7. Former smoker Quit smoking several years ago. Has never had low dose CT san.   8. BMI 23.0-23.9,adult Weight is down 4lbs  Wt Readings from Last 3 Encounters:  01/18/25 143 lb (64.9 kg)  01/03/25 147 lb (66.7 kg)  07/04/24 148 lb (67.1 kg)   BMI Readings from Last 3 Encounters:  01/18/25 22.40 kg/m  01/03/25 23.02 kg/m  07/04/24 23.18 kg/m       New complaints: None  today  Allergies  Allergen Reactions   Compazine [Prochlorperazine Edisylate]    Prochlorperazine Other (See Comments)   Outpatient Encounter Medications as of 01/18/2025  Medication Sig   amLODipine  (NORVASC ) 5 MG tablet Take 1 tablet (5 mg total) by mouth daily.   aspirin 81 MG tablet Take 81 mg by mouth daily.   Calcium  200 MG TABS Calcium    Calcium -Magnesium-Vitamin D 600-40-500 MG-MG-UNIT TB24 Take by mouth.   levothyroxine  (SYNTHROID ) 75 MCG tablet Take 1 tablet (75 mcg total) by mouth daily.   Multiple Vitamin (MULTIVITAMIN ADULT  PO) Multivitamin   rosuvastatin  (CRESTOR ) 20 MG tablet Take 1 tablet (20 mg total) by mouth daily.   sertraline  (ZOLOFT ) 100 MG tablet Take 1 tablet (100 mg total) by mouth daily.   tirzepatide  (MOUNJARO ) 5 MG/0.5ML Pen Inject 5 mg into the skin once a week.   zolpidem  (AMBIEN ) 10 MG tablet Take 1 tablet (10 mg total) by mouth at bedtime as needed for sleep.   No facility-administered encounter medications on file as of 01/18/2025.    Past Surgical History:  Procedure Laterality Date   CHOLECYSTECTOMY     TUBAL LIGATION      Family History  Problem Relation Age of Onset   Breast cancer Sister    Hypertension Sister    Diabetes Father    Hyperlipidemia Father    Hypertension Daughter    Diabetes Son    Hypertension Son    Hypertension Sister       Controlled substance contract: 01/05/23     Review  of Systems  Constitutional:  Negative for diaphoresis.  Eyes:  Negative for pain.  Respiratory:  Negative for shortness of breath.   Cardiovascular:  Negative for chest pain, palpitations and leg swelling.  Gastrointestinal:  Negative for abdominal pain.  Endocrine: Negative for polydipsia.  Skin:  Negative for rash.  Neurological:  Negative for dizziness, weakness and headaches.  Hematological:  Does not bruise/bleed easily.  All other systems reviewed and are negative.      Objective:   Physical Exam Vitals and nursing note reviewed.  Constitutional:      General: She is not in acute distress.    Appearance: Normal appearance. She is well-developed.  HENT:     Head: Normocephalic.     Right Ear: Tympanic membrane normal.     Left Ear: Tympanic membrane normal.     Nose: Nose normal.     Mouth/Throat:     Mouth: Mucous membranes are moist.  Eyes:     Pupils: Pupils are equal, round, and reactive to light.  Neck:     Vascular: No carotid bruit or JVD.  Cardiovascular:     Rate and Rhythm: Normal rate and regular rhythm.     Heart sounds: Normal heart sounds.  Pulmonary:     Effort: Pulmonary effort is normal. No respiratory distress.     Breath sounds: Normal breath sounds. No wheezing or rales.  Chest:     Chest wall: No tenderness.  Abdominal:     General: Bowel sounds are normal. There is no distension or abdominal bruit.     Palpations: Abdomen is soft. There is no hepatomegaly, splenomegaly, mass or pulsatile mass.     Tenderness: There is no abdominal tenderness.  Musculoskeletal:        General: Normal range of motion.     Cervical back: Normal range of motion and neck supple.  Lymphadenopathy:     Cervical: No cervical adenopathy.  Skin:    General: Skin is warm and dry.  Neurological:     Mental Status: She is alert and oriented to person, place, and time.     Deep Tendon Reflexes: Reflexes are normal and symmetric.  Psychiatric:        Behavior:  Behavior normal.        Thought Content: Thought content normal.        Judgment: Judgment normal.   There were no vitals taken for this visit.    HGBA1c 5.5%       Assessment & Plan:  Marissa Wells comes in today with chief complaint of medical management of chronic issues    Diagnosis and orders addressed:  1. Primary hypertension Low sodium diet - EKG 12-Lead - CBC with Differential/Platelet - CMP14+EGFR - amLODipine  (NORVASC ) 5 MG tablet; Take 1 tablet (5 mg total) by mouth daily.  Dispense: 90 tablet; Refill: 1  2. Hyperlipidemia associated with type 2 diabetes mellitus (HCC) Low fat diet - Lipid panel  3. Long-term current use of injectable noninsulin antidiabetic medication Continue to watch carbs in diet - Microalbumin / creatinine urine ratio - Bayer DCA Hb A1c Waived  4. Acquired hypothyroidism Labs pending - Thyroid  Panel With TSH - levothyroxine  (SYNTHROID ) 75 MCG tablet; Take 1 tablet (75 mcg total) by mouth daily.  Dispense: 90 tablet; Refill: 1  5. Recurrent major depressive disorder, in remission (HCC) Stress management - sertraline  (ZOLOFT ) 100 MG tablet; Take 1 tablet (100 mg total) by mouth daily.  Dispense: 90 tablet; Refill: 1  6. Primary insomnia Bedtime routine - zolpidem  (AMBIEN ) 10 MG tablet; Take 1 tablet (10 mg total) by mouth at bedtime as needed for sleep.  Dispense: 30 tablet; Refill: 5  7. Former smoker Ordered low dose CT scan - DG Chest 2 View - Ambulatory Referral for Lung Cancer Scre  8. BMI 23.0-23.9,adult Discussed diet and exercise for person with BMI >25 Will recheck weight in 3-6 months    Labs pending Health Maintenance reviewed Diet and exercise encouraged  Follow up plan: 6 months   Mary-Margaret Gladis, FNP  "

## 2025-01-18 NOTE — Patient Instructions (Signed)
 Exercising to Stay Healthy To become healthy and stay healthy, it is recommended that you do moderate-intensity and vigorous-intensity exercise. You can tell that you are exercising at a moderate intensity if your heart starts beating faster and you start breathing faster but can still hold a conversation. You can tell that you are exercising at a vigorous intensity if you are breathing much harder and faster and cannot hold a conversation while exercising. How can exercise benefit me? Exercising regularly is important. It has many health benefits, such as: Improving overall fitness, flexibility, and endurance. Increasing bone density. Helping with weight control. Decreasing body fat. Increasing muscle strength and endurance. Reducing stress and tension, anxiety, depression, or anger. Improving overall health. What guidelines should I follow while exercising? Before you start a new exercise program, talk with your health care provider. Do not exercise so much that you hurt yourself, feel dizzy, or get very short of breath. Wear comfortable clothes and wear shoes with good support. Drink plenty of water while you exercise to prevent dehydration or heat stroke. Work out until your breathing and your heartbeat get faster (moderate intensity). How often should I exercise? Choose an activity that you enjoy, and set realistic goals. Your health care provider can help you make an activity plan that is individually designed and works best for you. Exercise regularly as told by your health care provider. This may include: Doing strength training two times a week, such as: Lifting weights. Using resistance bands. Push-ups. Sit-ups. Yoga. Doing a certain intensity of exercise for a given amount of time. Choose from these options: A total of 150 minutes of moderate-intensity exercise every week. A total of 75 minutes of vigorous-intensity exercise every week. A mix of moderate-intensity and  vigorous-intensity exercise every week. Children, pregnant women, people who have not exercised regularly, people who are overweight, and older adults may need to talk with a health care provider about what activities are safe to perform. If you have a medical condition, be sure to talk with your health care provider before you start a new exercise program. What are some exercise ideas? Moderate-intensity exercise ideas include: Walking 1 mile (1.6 km) in about 15 minutes. Biking. Hiking. Golfing. Dancing. Water aerobics. Vigorous-intensity exercise ideas include: Walking 4.5 miles (7.2 km) or more in about 1 hour. Jogging or running 5 miles (8 km) in about 1 hour. Biking 10 miles (16.1 km) or more in about 1 hour. Lap swimming. Roller-skating or in-line skating. Cross-country skiing. Vigorous competitive sports, such as football, basketball, and soccer. Jumping rope. Aerobic dancing. What are some everyday activities that can help me get exercise? Yard work, such as: Child psychotherapist. Raking and bagging leaves. Washing your car. Pushing a stroller. Shoveling snow. Gardening. Washing windows or floors. How can I be more active in my day-to-day activities? Use stairs instead of an elevator. Take a walk during your lunch break. If you drive, park your car farther away from your work or school. If you take public transportation, get off one stop early and walk the rest of the way. Stand up or walk around during all of your indoor phone calls. Get up, stretch, and walk around every 30 minutes throughout the day. Enjoy exercise with a friend. Support to continue exercising will help you keep a regular routine of activity. Where to find more information You can find more information about exercising to stay healthy from: U.S. Department of Health and Human Services: ThisPath.fi Centers for Disease Control and Prevention (  CDC): [employer].br Summary Exercising regularly is  important. It will improve your overall fitness, flexibility, and endurance. Regular exercise will also improve your overall health. It can help you control your weight, reduce stress, and improve your bone density. Do not exercise so much that you hurt yourself, feel dizzy, or get very short of breath. Before you start a new exercise program, talk with your health care provider. This information is not intended to replace advice given to you by your health care provider. Make sure you discuss any questions you have with your health care provider. Document Revised: 04/11/2021 Document Reviewed: 04/11/2021 Elsevier Patient Education  2024 [employer].

## 2025-01-19 ENCOUNTER — Ambulatory Visit: Payer: Self-pay | Admitting: Nurse Practitioner

## 2025-01-19 LAB — CBC WITH DIFFERENTIAL/PLATELET
Basophils Absolute: 0.1 x10E3/uL (ref 0.0–0.2)
Basos: 1 %
EOS (ABSOLUTE): 0.2 x10E3/uL (ref 0.0–0.4)
Eos: 3 %
Hematocrit: 39.8 % (ref 34.0–46.6)
Hemoglobin: 13 g/dL (ref 11.1–15.9)
Immature Grans (Abs): 0 x10E3/uL (ref 0.0–0.1)
Immature Granulocytes: 0 %
Lymphocytes Absolute: 3.6 x10E3/uL — ABNORMAL HIGH (ref 0.7–3.1)
Lymphs: 52 %
MCH: 30.6 pg (ref 26.6–33.0)
MCHC: 32.7 g/dL (ref 31.5–35.7)
MCV: 94 fL (ref 79–97)
Monocytes Absolute: 0.5 x10E3/uL (ref 0.1–0.9)
Monocytes: 7 %
Neutrophils Absolute: 2.5 x10E3/uL (ref 1.4–7.0)
Neutrophils: 37 %
Platelets: 361 x10E3/uL (ref 150–450)
RBC: 4.25 x10E6/uL (ref 3.77–5.28)
RDW: 13.6 % (ref 11.7–15.4)
WBC: 6.8 x10E3/uL (ref 3.4–10.8)

## 2025-01-19 LAB — THYROID PANEL WITH TSH
Free Thyroxine Index: 2.8 (ref 1.2–4.9)
T3 Uptake Ratio: 29 % (ref 24–39)
T4, Total: 9.6 ug/dL (ref 4.5–12.0)
TSH: 1.33 u[IU]/mL (ref 0.450–4.500)

## 2025-01-19 LAB — CMP14+EGFR
ALT: 8 IU/L (ref 0–32)
AST: 13 IU/L (ref 0–40)
Albumin: 4.5 g/dL (ref 3.8–4.8)
Alkaline Phosphatase: 123 IU/L (ref 49–135)
BUN/Creatinine Ratio: 18 (ref 12–28)
BUN: 14 mg/dL (ref 8–27)
Bilirubin Total: 0.3 mg/dL (ref 0.0–1.2)
CO2: 19 mmol/L — AB (ref 20–29)
Calcium: 9.3 mg/dL (ref 8.7–10.3)
Chloride: 107 mmol/L — AB (ref 96–106)
Creatinine, Ser: 0.78 mg/dL (ref 0.57–1.00)
Globulin, Total: 3 g/dL (ref 1.5–4.5)
Glucose: 75 mg/dL (ref 70–99)
Potassium: 4.2 mmol/L (ref 3.5–5.2)
Sodium: 141 mmol/L (ref 134–144)
Total Protein: 7.5 g/dL (ref 6.0–8.5)
eGFR: 81 mL/min/1.73

## 2025-01-19 LAB — LIPID PANEL
Cholesterol, Total: 163 mg/dL (ref 100–199)
HDL: 57 mg/dL
LDL CALC COMMENT:: 2.9 ratio (ref 0.0–4.4)
LDL Chol Calc (NIH): 88 mg/dL (ref 0–99)
Triglycerides: 101 mg/dL (ref 0–149)
VLDL Cholesterol Cal: 18 mg/dL (ref 5–40)

## 2025-01-25 LAB — TOXASSURE SELECT 13 (MW), URINE

## 2025-07-13 ENCOUNTER — Ambulatory Visit: Admitting: Nurse Practitioner

## 2025-09-14 ENCOUNTER — Ambulatory Visit

## 2026-01-04 ENCOUNTER — Ambulatory Visit
